# Patient Record
Sex: Female | Born: 1975 | Race: Black or African American | Hispanic: No | Marital: Single
Health system: Midwestern US, Community
[De-identification: ages and names within clinical notes are randomized; demographics above are authoritative.]

## PROBLEM LIST (undated history)

## (undated) DIAGNOSIS — I10 Essential (primary) hypertension: Secondary | ICD-10-CM

## (undated) DIAGNOSIS — N611 Abscess of the breast and nipple: Secondary | ICD-10-CM

## (undated) DIAGNOSIS — G8918 Other acute postprocedural pain: Secondary | ICD-10-CM

## (undated) DIAGNOSIS — N649 Disorder of breast, unspecified: Secondary | ICD-10-CM

## (undated) HISTORY — PX: TUBAL LIGATION: SHX77

## (undated) HISTORY — PX: LAPAROSCOPIC LYSIS OF ADHESIONS: SHX5905

---

## 2007-07-02 ENCOUNTER — Emergency Department (HOSPITAL_COMMUNITY): Admission: EM | Admit: 2007-07-02 | Discharge: 2007-07-03 | Payer: Self-pay | Admitting: Emergency Medicine

## 2007-07-28 LAB — TYPE AND SCREEN
ABO/Rh: A POS
Antibody Screen: NEGATIVE

## 2007-07-28 LAB — BETA HCG, QT
Beta HCG, QT: 53243 m[IU]/mL — ABNORMAL HIGH (ref 0–6)
hCG Quant: 53243 m[IU]/mL — ABNORMAL HIGH (ref 0–6)

## 2007-07-28 LAB — URINALYSIS W/MICROSCOPIC
Bacteria: NEGATIVE /HPF
Bilirubin: NEGATIVE
Glucose: 500 MG/DL — AB
Ketone: 15 MG/DL — AB
Leukocyte Esterase: NEGATIVE
Nitrites: NEGATIVE
Protein: NEGATIVE MG/DL
Specific gravity: 1.013 (ref 1.003–1.030)
Urobilinogen: 0.2 EU/DL (ref 0.2–1.0)
pH (UA): 6 (ref 5.0–8.0)

## 2007-07-28 LAB — CBC WITH AUTOMATED DIFF
ABS. BASOPHILS: 0 10*3/uL (ref 0.0–0.1)
ABS. EOSINOPHILS: 0.1 10*3/uL (ref 0.0–0.4)
ABS. LYMPHOCYTES: 2.1 10*3/uL (ref 0.8–3.5)
ABS. MONOCYTES: 0.6 10*3/uL (ref 0–1.0)
ABS. NEUTROPHILS: 5.7 10*3/uL (ref 1.8–8.0)
BASOPHILS: 0 % (ref 0–1)
EOSINOPHILS: 1 % (ref 0–7)
HCT: 37 % (ref 35.0–47.0)
HGB: 13.1 g/dL (ref 11.5–16.0)
LYMPHOCYTES: 24 % (ref 12–49)
MCH: 28.7 PG (ref 26.0–34.0)
MCHC: 35.4 g/dL — ABNORMAL HIGH (ref 30.0–35.0)
MCV: 81.1 FL (ref 80.0–99.0)
MONOCYTES: 7 % (ref 5–13)
NEUTROPHILS: 68 % (ref 32–75)
PLATELET: 360 10*3/uL (ref 150–400)
RBC: 4.56 M/uL (ref 3.80–5.20)
RDW: 12.9 % (ref 11.5–14.5)
WBC: 8.5 10*3/uL (ref 3.6–11.0)

## 2007-07-28 LAB — TYPE & SCREEN
ABO/Rh(D): A POS
Antibody screen: NEGATIVE

## 2007-07-28 LAB — GLUCOSE, POC: Glucose (POC): 234 mg/dL — ABNORMAL HIGH (ref 65–105)

## 2007-07-31 LAB — METABOLIC PANEL, COMPREHENSIVE
A-G Ratio: 0.9 — ABNORMAL LOW (ref 1.1–2.2)
ALT (SGPT): 28 U/L — ABNORMAL LOW (ref 30–65)
AST (SGOT): 11 U/L — ABNORMAL LOW (ref 15–37)
Albumin: 3.6 g/dL (ref 3.5–5.0)
Alk. phosphatase: 74 U/L (ref 50–136)
Anion gap: 10 (ref 5–15)
BUN/Creatinine ratio: 5 — ABNORMAL LOW (ref 12–20)
BUN: 4 MG/DL — ABNORMAL LOW (ref 6–20)
Bilirubin, total: 0.4 MG/DL (ref 0–1.0)
CO2: 24 MMOL/L (ref 21–32)
Calcium: 9.1 MG/DL (ref 8.5–10.1)
Chloride: 101 MMOL/L (ref 97–108)
Creatinine: 0.8 MG/DL (ref 0.6–1.3)
GFR est AA: >60 mL/min/{1.73_m2} (ref >60)
GFR est non-AA: >60 mL/min/{1.73_m2} (ref >60)
Globulin: 3.8 g/dL (ref 2.0–4.0)
Glucose: 197 MG/DL — ABNORMAL HIGH (ref 65–105)
Potassium: 3.9 MMOL/L (ref 3.5–5.1)
Protein, total: 7.4 g/dL (ref 6.4–8.2)
Sodium: 135 MMOL/L — ABNORMAL LOW (ref 136–145)

## 2007-07-31 LAB — CBC WITH AUTOMATED DIFF
ABS. BASOPHILS: 0 10*3/uL (ref 0.0–0.1)
ABS. EOSINOPHILS: 0 10*3/uL (ref 0.0–0.4)
ABS. LYMPHOCYTES: 2.6 10*3/uL (ref 0.8–3.5)
ABS. MONOCYTES: 0.5 10*3/uL (ref 0–1.0)
ABS. NEUTROPHILS: 5.3 10*3/uL (ref 1.8–8.0)
BASOPHILS: 0 % (ref 0–1)
EOSINOPHILS: 1 % (ref 0–7)
HCT: 38.8 % (ref 35.0–47.0)
HGB: 13.6 g/dL (ref 11.5–16.0)
LYMPHOCYTES: 30 % (ref 12–49)
MCH: 28.7 PG (ref 26.0–34.0)
MCHC: 35.1 g/dL — ABNORMAL HIGH (ref 30.0–35.0)
MCV: 81.9 FL (ref 80.0–99.0)
MONOCYTES: 6 % (ref 5–13)
NEUTROPHILS: 63 % (ref 32–75)
PLATELET: 401 10*3/uL — ABNORMAL HIGH (ref 150–400)
RBC: 4.74 M/uL (ref 3.80–5.20)
RDW: 12.6 % (ref 11.5–14.5)
WBC: 8.4 10*3/uL (ref 3.6–11.0)

## 2007-07-31 LAB — URINALYSIS W/ REFLEX CULTURE
Bacteria: NEGATIVE /HPF
Bilirubin: NEGATIVE
Blood: NEGATIVE
Glucose: 500 MG/DL — AB
Leukocyte Esterase: NEGATIVE
Nitrites: NEGATIVE
Protein: NEGATIVE MG/DL
Specific gravity: 1.02 (ref 1.003–1.030)
Urobilinogen: 0.2 EU/DL (ref 0.2–1.0)
pH (UA): 6 (ref 5.0–8.0)

## 2007-07-31 LAB — GLUCOSE, POC: Glucose (POC): 114 mg/dL — ABNORMAL HIGH (ref 65–105)

## 2007-08-01 LAB — TYPE AND SCREEN
ABO/Rh: A POS
Antibody Screen: NEGATIVE

## 2007-08-01 LAB — CBC WITH AUTOMATED DIFF
ABS. BASOPHILS: 0 10*3/uL (ref 0.0–0.1)
ABS. EOSINOPHILS: 0.1 10*3/uL (ref 0.0–0.4)
ABS. LYMPHOCYTES: 2.8 10*3/uL (ref 0.8–3.5)
ABS. MONOCYTES: 0.5 10*3/uL (ref 0–1.0)
ABS. NEUTROPHILS: 4.8 10*3/uL (ref 1.8–8.0)
BASOPHILS: 0 % (ref 0–1)
EOSINOPHILS: 1 % (ref 0–7)
HCT: 36.8 % (ref 35.0–47.0)
HGB: 12.6 g/dL (ref 11.5–16.0)
LYMPHOCYTES: 34 % (ref 12–49)
MCH: 28 PG (ref 26.0–34.0)
MCHC: 34.2 g/dL (ref 30.0–35.0)
MCV: 81.8 FL (ref 80.0–99.0)
MONOCYTES: 6 % (ref 5–13)
NEUTROPHILS: 59 % (ref 32–75)
PLATELET: 388 10*3/uL (ref 150–400)
RBC: 4.5 M/uL (ref 3.80–5.20)
RDW: 12.7 % (ref 11.5–14.5)
WBC: 8.1 10*3/uL (ref 3.6–11.0)

## 2007-08-01 LAB — METABOLIC PANEL, COMPREHENSIVE
A-G Ratio: 0.9 — ABNORMAL LOW (ref 1.1–2.2)
ALT (SGPT): 28 U/L — ABNORMAL LOW (ref 30–65)
AST (SGOT): 10 U/L — ABNORMAL LOW (ref 15–37)
Albumin: 3.2 g/dL — ABNORMAL LOW (ref 3.5–5.0)
Alk. phosphatase: 73 U/L (ref 50–136)
Anion gap: 11 (ref 5–15)
BUN/Creatinine ratio: 8 — ABNORMAL LOW (ref 12–20)
BUN: 6 MG/DL (ref 6–20)
Bilirubin, total: 0.2 MG/DL (ref 0–1.0)
CO2: 23 MMOL/L (ref 21–32)
Calcium: 8.7 MG/DL (ref 8.5–10.1)
Chloride: 102 MMOL/L (ref 97–108)
Creatinine: 0.8 MG/DL (ref 0.6–1.3)
GFR est AA: >60 mL/min/{1.73_m2} (ref >60)
GFR est non-AA: >60 mL/min/{1.73_m2} (ref >60)
Globulin: 3.5 g/dL (ref 2.0–4.0)
Glucose: 192 MG/DL — ABNORMAL HIGH (ref 65–105)
Potassium: 3.8 MMOL/L (ref 3.5–5.1)
Protein, total: 6.7 g/dL (ref 6.4–8.2)
Sodium: 136 MMOL/L (ref 136–145)

## 2007-08-01 LAB — TYPE & SCREEN
ABO/Rh(D): A POS
Antibody screen: NEGATIVE

## 2007-08-01 LAB — GLUCOSE, POC
Glucose (POC): 186 mg/dL — ABNORMAL HIGH (ref 65–105)
Glucose (POC): 229 mg/dL — ABNORMAL HIGH (ref 65–105)
Glucose (POC): 129 mg/dL — ABNORMAL HIGH (ref 65–105)
Glucose (POC): 166 mg/dL — ABNORMAL HIGH (ref 65–105)
Glucose (POC): 137 mg/dL — ABNORMAL HIGH (ref 65–105)

## 2007-08-02 LAB — HEMOGLOBIN A1C WITH EAG: Hemoglobin A1c: 7.9 % — ABNORMAL HIGH (ref 4.2–5.8)

## 2008-01-04 ENCOUNTER — Emergency Department (HOSPITAL_COMMUNITY): Admission: EM | Admit: 2008-01-04 | Discharge: 2008-01-04 | Payer: Self-pay | Admitting: Emergency Medicine

## 2009-02-08 IMAGING — US US OB COMP LESS 14 WK
1 series · 14 of 28 positions shown · non-contrast
Comparison: none

CLINICAL DATA: Bloody discharge

TRANSABDOMINAL AND TRANSVAGINAL OB ULTRASOUND, LESS THAN 14 WEEKS:

[Series 1: unknown · 0.32mm/px · 14 of 44 slices shown]
[im 2/44]
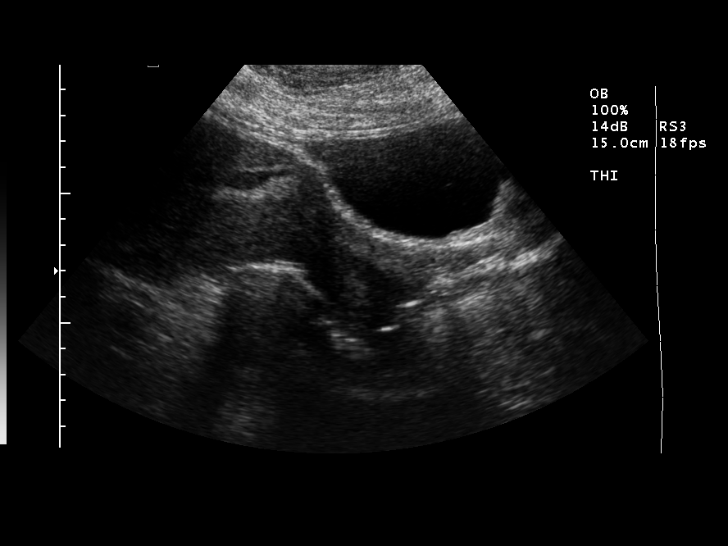
[im 5/44]
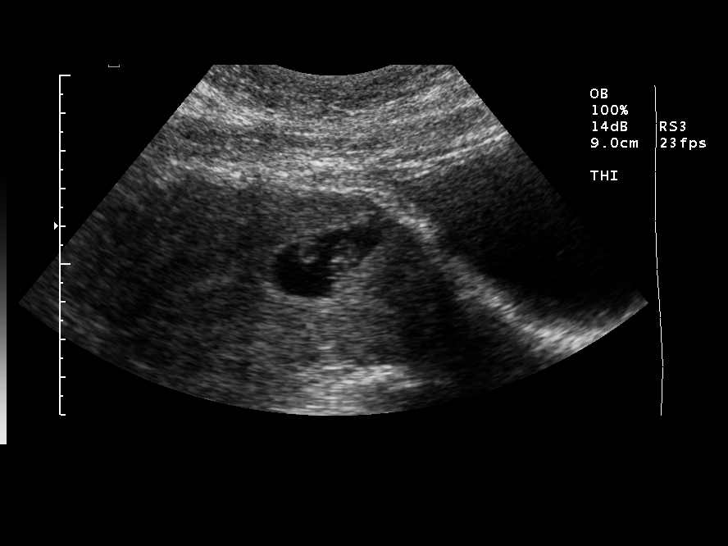
[im 8/44]
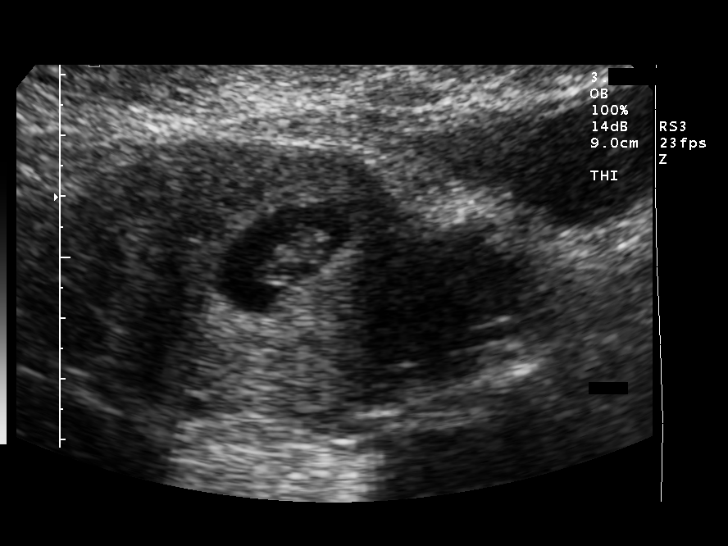
[im 12/44]
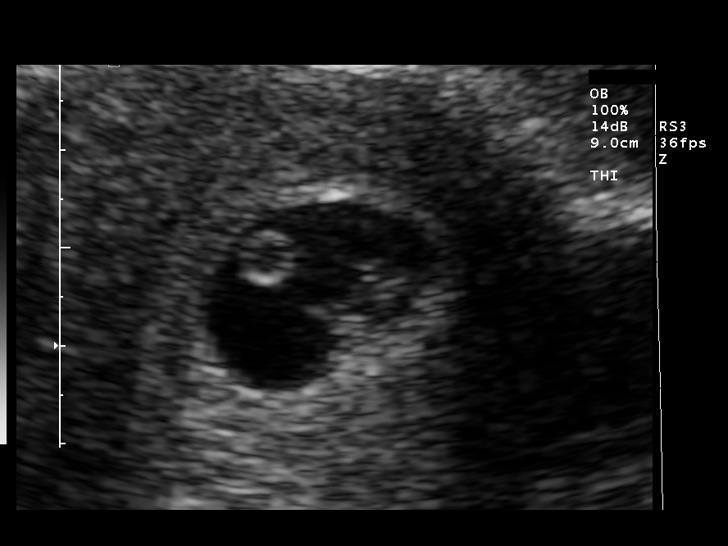
[im 15/44]
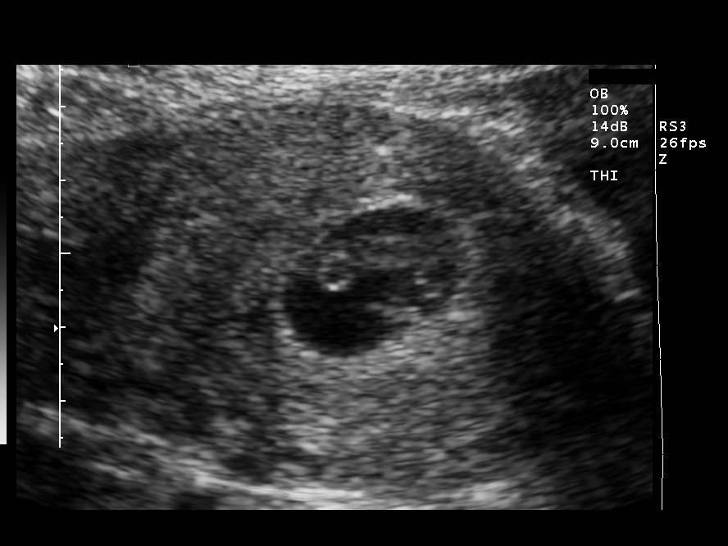
[im 18/44]
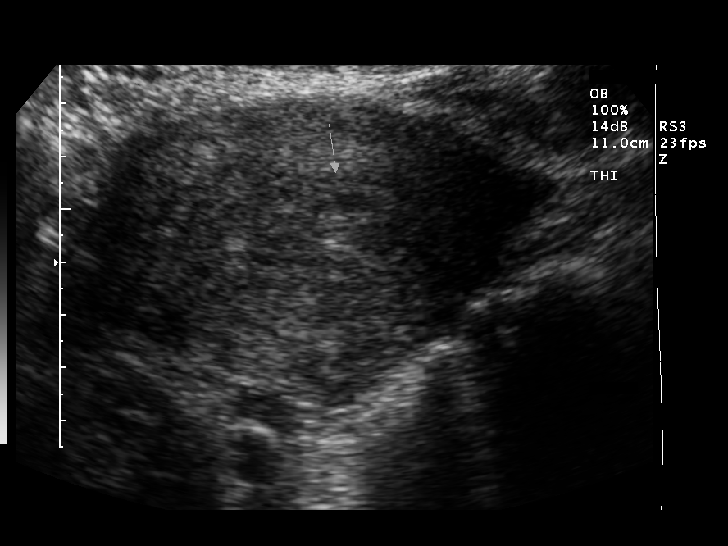
[im 21/44]
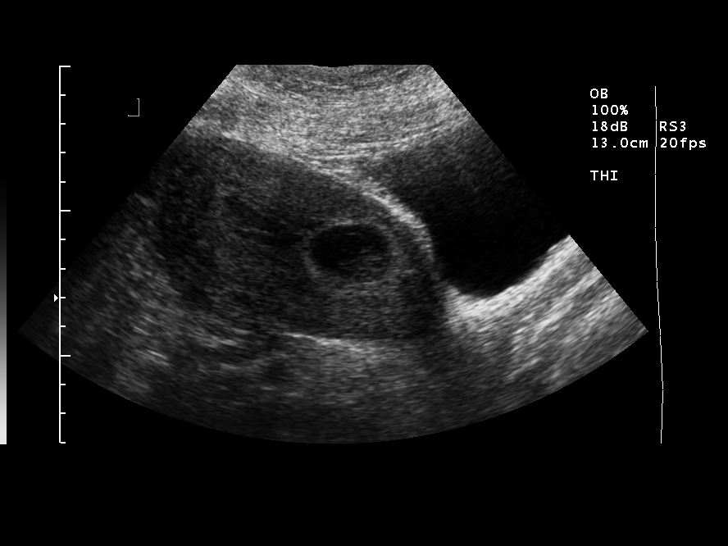
[im 24/44]
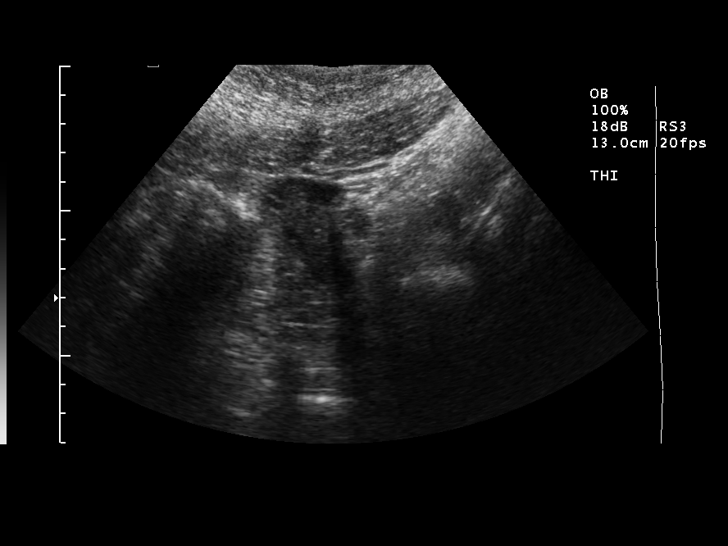
[im 28/44]
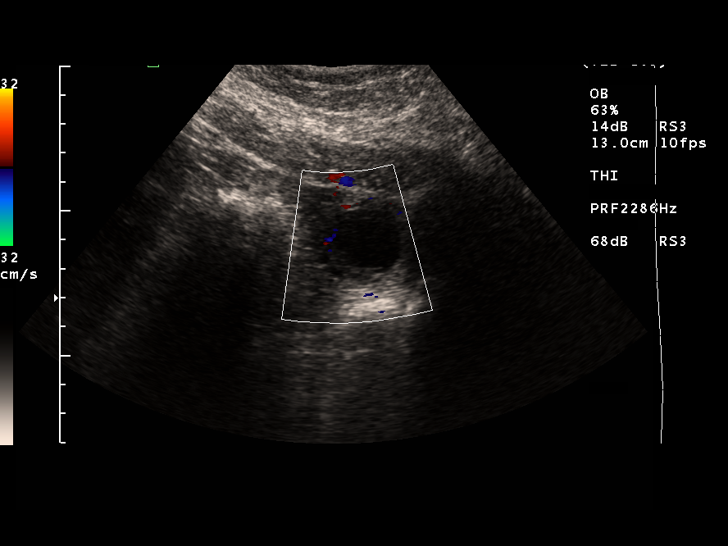
[im 31/44]
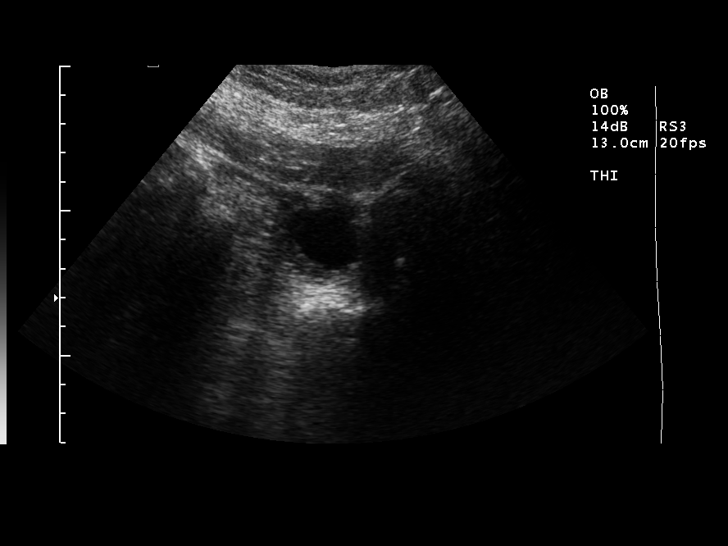
[im 34/44]
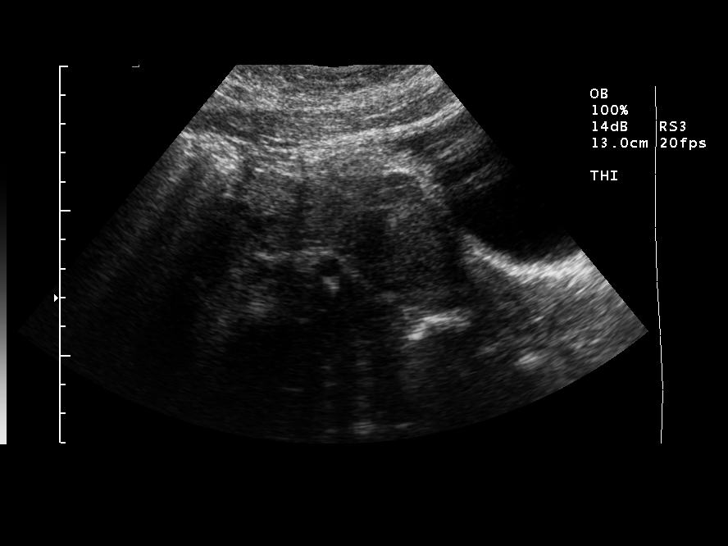
[im 37/44]
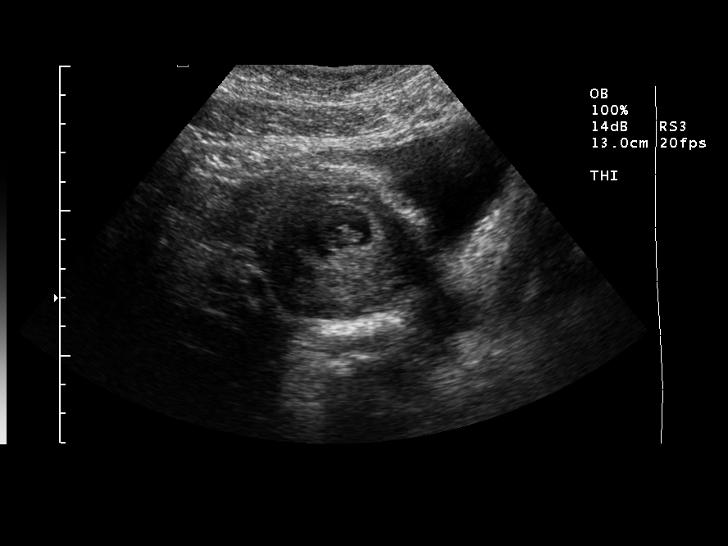
[im 40/44]
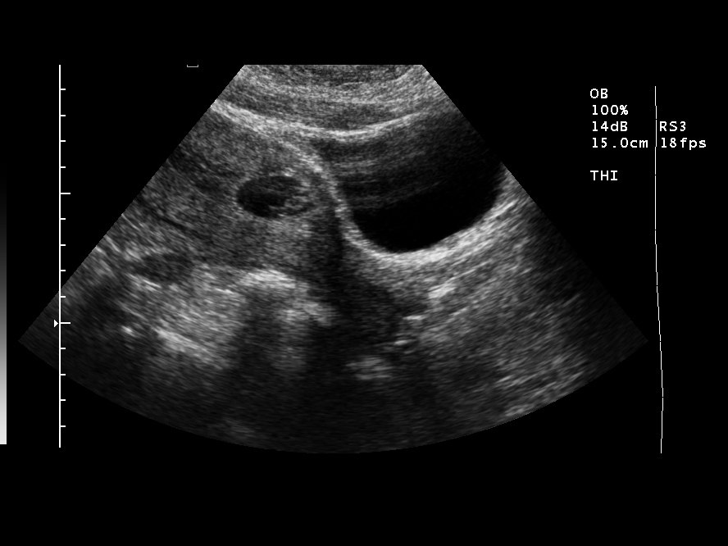
[im 44/44]
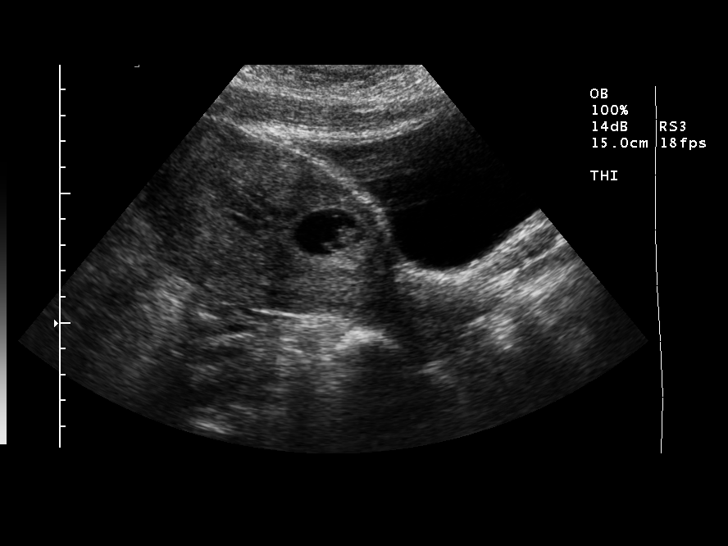

[14 of 28 positions shown; findings below may reference images not displayed]

FINDINGS: There is a single intrauterine pregnancy. Based on crown-rump length,
estimated gestational age is 7 weeks 4 days. Fetal heart rate 157 beats per
minute.

There is a small subchorionic hemorrhage. Ovaries are unremarkable. Small left
corpus luteum cyst present. No free fluid.
IMPRESSION: Single viable intrauterine pregnancy, 7 weeks 4 days by crown-rump length. Fetal
heart rate 157 beats per minute.

Small subchorionic hemorrhage.

## 2009-05-07 ENCOUNTER — Inpatient Hospital Stay (HOSPITAL_COMMUNITY): Admission: AD | Admit: 2009-05-07 | Discharge: 2009-05-07 | Payer: Self-pay | Admitting: Obstetrics and Gynecology

## 2009-07-05 ENCOUNTER — Inpatient Hospital Stay (HOSPITAL_COMMUNITY): Admission: AD | Admit: 2009-07-05 | Discharge: 2009-07-05 | Payer: Self-pay | Admitting: Family Medicine

## 2009-07-08 ENCOUNTER — Ambulatory Visit: Payer: Self-pay | Admitting: Obstetrics & Gynecology

## 2009-07-08 ENCOUNTER — Encounter: Admission: RE | Admit: 2009-07-08 | Discharge: 2009-07-08 | Payer: Self-pay | Admitting: Obstetrics & Gynecology

## 2009-07-10 ENCOUNTER — Ambulatory Visit (HOSPITAL_COMMUNITY): Admission: RE | Admit: 2009-07-10 | Discharge: 2009-07-10 | Payer: Self-pay | Admitting: Obstetrics & Gynecology

## 2009-07-11 ENCOUNTER — Ambulatory Visit: Payer: Self-pay | Admitting: Obstetrics & Gynecology

## 2009-07-18 ENCOUNTER — Ambulatory Visit: Payer: Self-pay | Admitting: Obstetrics & Gynecology

## 2009-07-25 ENCOUNTER — Ambulatory Visit: Payer: Self-pay | Admitting: Obstetrics & Gynecology

## 2009-07-29 ENCOUNTER — Ambulatory Visit: Payer: Self-pay | Admitting: Obstetrics & Gynecology

## 2009-08-05 ENCOUNTER — Encounter: Payer: Self-pay | Admitting: Obstetrics & Gynecology

## 2009-08-05 ENCOUNTER — Ambulatory Visit: Payer: Self-pay | Admitting: Obstetrics & Gynecology

## 2009-08-05 LAB — CONVERTED CEMR LAB
Albumin: 4 g/dL (ref 3.5–5.2)
Chloride: 101 meq/L (ref 96–112)
Collection Interval-CRCL: 24 hr
Creatinine Clearance: 232 mL/min — ABNORMAL HIGH (ref 75–115)
Creatinine, Ser: 0.72 mg/dL (ref 0.40–1.20)
Creatinine, Urine: 70.7 mg/dL
Hgb A1c MFr Bld: 7.3 % — ABNORMAL HIGH (ref 4.6–6.1)
MCV: 84.5 fL (ref 78.0–100.0)
Platelets: 382 10*3/uL (ref 150–400)
Potassium: 4.1 meq/L (ref 3.5–5.3)
RBC: 4.51 M/uL (ref 3.87–5.11)
Sodium: 136 meq/L (ref 135–145)
Total Bilirubin: 0.4 mg/dL (ref 0.3–1.2)
Total Protein: 7.5 g/dL (ref 6.0–8.3)
WBC: 12.4 10*3/uL — ABNORMAL HIGH (ref 4.0–10.5)

## 2009-08-15 ENCOUNTER — Ambulatory Visit: Payer: Self-pay | Admitting: Obstetrics & Gynecology

## 2009-08-18 ENCOUNTER — Ambulatory Visit: Payer: Self-pay | Admitting: Obstetrics and Gynecology

## 2009-08-18 ENCOUNTER — Inpatient Hospital Stay (HOSPITAL_COMMUNITY): Admission: AD | Admit: 2009-08-18 | Discharge: 2009-08-18 | Payer: Self-pay | Admitting: Obstetrics & Gynecology

## 2009-08-19 ENCOUNTER — Ambulatory Visit (HOSPITAL_COMMUNITY): Admission: RE | Admit: 2009-08-19 | Discharge: 2009-08-19 | Payer: Self-pay | Admitting: Obstetrics & Gynecology

## 2009-08-19 ENCOUNTER — Ambulatory Visit: Payer: Self-pay | Admitting: Obstetrics & Gynecology

## 2009-08-22 ENCOUNTER — Ambulatory Visit (HOSPITAL_COMMUNITY): Admission: RE | Admit: 2009-08-22 | Discharge: 2009-08-22 | Payer: Self-pay | Admitting: Obstetrics & Gynecology

## 2009-08-26 ENCOUNTER — Ambulatory Visit: Payer: Self-pay | Admitting: Obstetrics & Gynecology

## 2009-09-04 ENCOUNTER — Encounter: Payer: Self-pay | Admitting: Obstetrics & Gynecology

## 2009-09-04 ENCOUNTER — Ambulatory Visit: Payer: Self-pay | Admitting: Obstetrics & Gynecology

## 2009-09-04 LAB — CONVERTED CEMR LAB

## 2009-09-09 ENCOUNTER — Ambulatory Visit (HOSPITAL_COMMUNITY): Admission: RE | Admit: 2009-09-09 | Discharge: 2009-09-09 | Payer: Self-pay | Admitting: Obstetrics & Gynecology

## 2009-09-09 ENCOUNTER — Ambulatory Visit: Payer: Self-pay | Admitting: Obstetrics & Gynecology

## 2009-09-16 ENCOUNTER — Ambulatory Visit: Payer: Self-pay | Admitting: Obstetrics & Gynecology

## 2009-09-16 LAB — CONVERTED CEMR LAB
ALT: 15 units/L (ref 0–35)
Alkaline Phosphatase: 55 units/L (ref 39–117)
HCT: 38.3 % (ref 36.0–46.0)
Hemoglobin: 12.9 g/dL (ref 12.0–15.0)
Platelets: 406 10*3/uL — ABNORMAL HIGH (ref 150–400)
RBC: 4.53 M/uL (ref 3.87–5.11)
RDW: 13.3 % (ref 11.5–15.5)
Sodium: 135 meq/L (ref 135–145)
Total Bilirubin: 0.3 mg/dL (ref 0.3–1.2)
Total Protein: 7 g/dL (ref 6.0–8.3)
WBC: 8.8 10*3/uL (ref 4.0–10.5)

## 2009-09-23 ENCOUNTER — Ambulatory Visit: Payer: Self-pay | Admitting: Obstetrics and Gynecology

## 2009-09-30 ENCOUNTER — Encounter: Admission: RE | Admit: 2009-09-30 | Discharge: 2009-10-18 | Payer: Self-pay | Admitting: Obstetrics & Gynecology

## 2009-09-30 ENCOUNTER — Ambulatory Visit: Payer: Self-pay | Admitting: Obstetrics & Gynecology

## 2009-10-08 ENCOUNTER — Ambulatory Visit (HOSPITAL_COMMUNITY): Admission: RE | Admit: 2009-10-08 | Discharge: 2009-10-08 | Payer: Self-pay | Admitting: Obstetrics & Gynecology

## 2009-10-08 ENCOUNTER — Ambulatory Visit: Payer: Self-pay | Admitting: Obstetrics & Gynecology

## 2009-10-11 ENCOUNTER — Ambulatory Visit: Payer: Self-pay | Admitting: Obstetrics and Gynecology

## 2009-10-15 ENCOUNTER — Ambulatory Visit: Payer: Self-pay | Admitting: Obstetrics & Gynecology

## 2009-10-17 ENCOUNTER — Inpatient Hospital Stay (HOSPITAL_COMMUNITY): Admission: AD | Admit: 2009-10-17 | Discharge: 2009-10-20 | Payer: Self-pay | Admitting: Obstetrics & Gynecology

## 2009-10-17 ENCOUNTER — Ambulatory Visit: Payer: Self-pay | Admitting: Family Medicine

## 2009-10-20 ENCOUNTER — Ambulatory Visit: Payer: Self-pay | Admitting: Obstetrics and Gynecology

## 2009-10-21 ENCOUNTER — Ambulatory Visit: Payer: Self-pay | Admitting: Physician Assistant

## 2009-10-21 ENCOUNTER — Inpatient Hospital Stay (HOSPITAL_COMMUNITY): Admission: AD | Admit: 2009-10-21 | Discharge: 2009-10-21 | Payer: Self-pay | Admitting: Obstetrics & Gynecology

## 2009-10-24 ENCOUNTER — Ambulatory Visit: Payer: Self-pay | Admitting: Obstetrics & Gynecology

## 2009-10-28 ENCOUNTER — Ambulatory Visit (HOSPITAL_COMMUNITY): Admission: RE | Admit: 2009-10-28 | Discharge: 2009-10-28 | Payer: Self-pay | Admitting: Obstetrics & Gynecology

## 2009-10-28 ENCOUNTER — Ambulatory Visit: Payer: Self-pay | Admitting: Obstetrics & Gynecology

## 2009-10-28 ENCOUNTER — Encounter: Payer: Self-pay | Admitting: Family Medicine

## 2009-10-31 ENCOUNTER — Inpatient Hospital Stay (HOSPITAL_COMMUNITY): Admission: AD | Admit: 2009-10-31 | Discharge: 2009-10-31 | Payer: Self-pay | Admitting: Obstetrics & Gynecology

## 2009-10-31 ENCOUNTER — Encounter: Payer: Self-pay | Admitting: Family Medicine

## 2009-10-31 ENCOUNTER — Ambulatory Visit: Payer: Self-pay | Admitting: Family Medicine

## 2009-10-31 ENCOUNTER — Ambulatory Visit: Payer: Self-pay | Admitting: Obstetrics & Gynecology

## 2009-11-04 ENCOUNTER — Ambulatory Visit (HOSPITAL_COMMUNITY): Admission: RE | Admit: 2009-11-04 | Discharge: 2009-11-04 | Payer: Self-pay | Admitting: Obstetrics & Gynecology

## 2009-11-04 ENCOUNTER — Ambulatory Visit: Payer: Self-pay | Admitting: Obstetrics & Gynecology

## 2009-11-05 ENCOUNTER — Inpatient Hospital Stay (HOSPITAL_COMMUNITY): Admission: AD | Admit: 2009-11-05 | Discharge: 2009-11-05 | Payer: Self-pay | Admitting: Family Medicine

## 2009-11-05 ENCOUNTER — Encounter: Payer: Self-pay | Admitting: Obstetrics & Gynecology

## 2009-11-07 ENCOUNTER — Inpatient Hospital Stay (HOSPITAL_COMMUNITY): Admission: RE | Admit: 2009-11-07 | Discharge: 2009-11-11 | Payer: Self-pay | Admitting: Obstetrics and Gynecology

## 2009-11-07 ENCOUNTER — Encounter: Payer: Self-pay | Admitting: Family Medicine

## 2009-11-12 ENCOUNTER — Ambulatory Visit: Payer: Self-pay | Admitting: Family Medicine

## 2010-01-08 ENCOUNTER — Ambulatory Visit: Payer: Self-pay | Admitting: Obstetrics and Gynecology

## 2010-01-08 LAB — CONVERTED CEMR LAB
Hemoglobin: 15.2 g/dL — ABNORMAL HIGH (ref 12.0–15.0)
MCHC: 34.9 g/dL (ref 30.0–36.0)
Platelets: 398 10*3/uL (ref 150–400)
RDW: 13.4 % (ref 11.5–15.5)

## 2010-06-01 ENCOUNTER — Encounter: Payer: Self-pay | Admitting: Family Medicine

## 2010-06-01 ENCOUNTER — Encounter: Payer: Self-pay | Admitting: Obstetrics & Gynecology

## 2010-07-27 LAB — POCT URINALYSIS DIP (DEVICE)
Bilirubin Urine: NEGATIVE
Glucose, UA: 500 mg/dL — AB
Nitrite: NEGATIVE
Nitrite: NEGATIVE
Nitrite: NEGATIVE
Protein, ur: 30 mg/dL — AB
Protein, ur: NEGATIVE mg/dL
Urobilinogen, UA: 0.2 mg/dL (ref 0.0–1.0)
Urobilinogen, UA: 0.2 mg/dL (ref 0.0–1.0)
Urobilinogen, UA: 0.2 mg/dL (ref 0.0–1.0)
pH: 5.5 (ref 5.0–8.0)

## 2010-07-27 LAB — GLUCOSE, CAPILLARY
Glucose-Capillary: 175 mg/dL — ABNORMAL HIGH (ref 70–99)
Glucose-Capillary: 200 mg/dL — ABNORMAL HIGH (ref 70–99)
Glucose-Capillary: 225 mg/dL — ABNORMAL HIGH (ref 70–99)
Glucose-Capillary: 233 mg/dL — ABNORMAL HIGH (ref 70–99)
Glucose-Capillary: 243 mg/dL — ABNORMAL HIGH (ref 70–99)
Glucose-Capillary: 260 mg/dL — ABNORMAL HIGH (ref 70–99)

## 2010-07-27 LAB — COMPREHENSIVE METABOLIC PANEL
AST: 19 U/L (ref 0–37)
Albumin: 2.5 g/dL — ABNORMAL LOW (ref 3.5–5.2)
Alkaline Phosphatase: 88 U/L (ref 39–117)
Chloride: 107 mEq/L (ref 96–112)
Creatinine, Ser: 0.64 mg/dL (ref 0.4–1.2)
GFR calc Af Amer: >60 mL/min (ref >60)
Potassium: 3.9 mEq/L (ref 3.5–5.1)
Total Bilirubin: 0.3 mg/dL (ref 0.3–1.2)
Total Protein: 5.9 g/dL — ABNORMAL LOW (ref 6.0–8.3)

## 2010-07-27 LAB — BASIC METABOLIC PANEL
CO2: 22 mEq/L (ref 19–32)
Calcium: 8.9 mg/dL (ref 8.4–10.5)
Creatinine, Ser: 0.73 mg/dL (ref 0.4–1.2)
GFR calc Af Amer: >60 mL/min (ref >60)
Sodium: 136 mEq/L (ref 135–145)

## 2010-07-27 LAB — CBC
Hemoglobin: 12.2 g/dL (ref 12.0–15.0)
Hemoglobin: 12.8 g/dL (ref 12.0–15.0)
MCH: 29.9 pg (ref 26.0–34.0)
MCH: 29.3 pg (ref 26.0–34.0)
MCHC: 33.8 g/dL (ref 30.0–36.0)
Platelets: 319 10*3/uL (ref 150–400)
Platelets: 331 10*3/uL (ref 150–400)
Platelets: 365 10*3/uL (ref 150–400)
RBC: 4.03 MIL/uL (ref 3.87–5.11)
RBC: 4.36 MIL/uL (ref 3.87–5.11)
RDW: 13.2 % (ref 11.5–15.5)
WBC: 4.6 10*3/uL (ref 4.0–10.5)
WBC: 5.1 10*3/uL (ref 4.0–10.5)

## 2010-07-27 LAB — TYPE AND SCREEN: ABO/RH(D): A POS

## 2010-07-27 LAB — RPR: RPR Ser Ql: NONREACTIVE

## 2010-07-28 LAB — GC/CHLAMYDIA PROBE AMP, GENITAL: Chlamydia, DNA Probe: NEGATIVE

## 2010-07-28 LAB — POCT URINALYSIS DIP (DEVICE)
Bilirubin Urine: NEGATIVE
Hgb urine dipstick: NEGATIVE
Hgb urine dipstick: NEGATIVE
Nitrite: NEGATIVE
Nitrite: NEGATIVE
Protein, ur: 100 mg/dL — AB
Protein, ur: NEGATIVE mg/dL
Specific Gravity, Urine: <=1.005 (ref 1.005–1.030)
Urobilinogen, UA: 0.2 mg/dL (ref 0.0–1.0)
pH: 5 (ref 5.0–8.0)
pH: 5.5 (ref 5.0–8.0)

## 2010-07-28 LAB — BLOOD GAS, ARTERIAL
Bicarbonate: 19.3 mEq/L — ABNORMAL LOW (ref 20.0–24.0)
Drawn by: 143
TCO2: 20.3 mmol/L (ref 0–100)
pCO2 arterial: 31.7 mmHg — ABNORMAL LOW (ref 35.0–45.0)
pH, Arterial: 7.401 — ABNORMAL HIGH (ref 7.350–7.400)

## 2010-07-28 LAB — GLUCOSE, CAPILLARY
Glucose-Capillary: 126 mg/dL — ABNORMAL HIGH (ref 70–99)
Glucose-Capillary: 129 mg/dL — ABNORMAL HIGH (ref 70–99)
Glucose-Capillary: 173 mg/dL — ABNORMAL HIGH (ref 70–99)
Glucose-Capillary: 186 mg/dL — ABNORMAL HIGH (ref 70–99)
Glucose-Capillary: 192 mg/dL — ABNORMAL HIGH (ref 70–99)
Glucose-Capillary: 193 mg/dL — ABNORMAL HIGH (ref 70–99)
Glucose-Capillary: 206 mg/dL — ABNORMAL HIGH (ref 70–99)
Glucose-Capillary: 213 mg/dL — ABNORMAL HIGH (ref 70–99)
Glucose-Capillary: 233 mg/dL — ABNORMAL HIGH (ref 70–99)
Glucose-Capillary: 297 mg/dL — ABNORMAL HIGH (ref 70–99)
Glucose-Capillary: 311 mg/dL — ABNORMAL HIGH (ref 70–99)
Glucose-Capillary: 322 mg/dL — ABNORMAL HIGH (ref 70–99)

## 2010-07-28 LAB — CBC
HCT: 35.3 % — ABNORMAL LOW (ref 36.0–46.0)
Hemoglobin: 11.7 g/dL — ABNORMAL LOW (ref 12.0–15.0)
MCHC: 33.5 g/dL (ref 30.0–36.0)
MCV: 87.9 fL (ref 78.0–100.0)
Platelets: 331 10*3/uL (ref 150–400)
Platelets: 347 10*3/uL (ref 150–400)
RBC: 4.02 MIL/uL (ref 3.87–5.11)
RDW: 13.6 % (ref 11.5–15.5)
WBC: 7.4 10*3/uL (ref 4.0–10.5)

## 2010-07-28 LAB — COMPREHENSIVE METABOLIC PANEL
ALT: 21 U/L (ref 0–35)
Albumin: 2.7 g/dL — ABNORMAL LOW (ref 3.5–5.2)
Calcium: 7.4 mg/dL — ABNORMAL LOW (ref 8.4–10.5)
Glucose, Bld: 262 mg/dL — ABNORMAL HIGH (ref 70–99)
Sodium: 133 mEq/L — ABNORMAL LOW (ref 135–145)
Total Protein: 6.2 g/dL (ref 6.0–8.3)

## 2010-07-28 LAB — URINALYSIS, ROUTINE W REFLEX MICROSCOPIC
Glucose, UA: 100 mg/dL — AB
Glucose, UA: >1000 mg/dL — AB
Leukocytes, UA: NEGATIVE
Nitrite: NEGATIVE
Specific Gravity, Urine: 1.02 (ref 1.005–1.030)
pH: 5.5 (ref 5.0–8.0)
pH: 6 (ref 5.0–8.0)

## 2010-07-28 LAB — RPR: RPR Ser Ql: NONREACTIVE

## 2010-07-28 LAB — RAPID URINE DRUG SCREEN, HOSP PERFORMED
Amphetamines: NOT DETECTED
Barbiturates: NOT DETECTED
Benzodiazepines: NOT DETECTED
Cocaine: NOT DETECTED

## 2010-07-28 LAB — TYPE AND SCREEN
ABO/RH(D): A POS
Antibody Screen: NEGATIVE

## 2010-07-28 LAB — STREP B DNA PROBE

## 2010-07-28 LAB — WET PREP, GENITAL: Yeast Wet Prep HPF POC: NONE SEEN

## 2010-07-28 LAB — PROTEIN / CREATININE RATIO, URINE: Creatinine, Urine: 24.3 mg/dL

## 2010-07-28 LAB — MAGNESIUM: Magnesium: 4.2 mg/dL — ABNORMAL HIGH (ref 1.5–2.5)

## 2010-07-28 LAB — URINE MICROSCOPIC-ADD ON

## 2010-07-29 LAB — POCT URINALYSIS DIP (DEVICE)
Glucose, UA: >=1000 mg/dL — AB
Hgb urine dipstick: NEGATIVE
Hgb urine dipstick: NEGATIVE
Ketones, ur: NEGATIVE mg/dL
Nitrite: NEGATIVE
Nitrite: NEGATIVE
Protein, ur: NEGATIVE mg/dL
Protein, ur: NEGATIVE mg/dL
Specific Gravity, Urine: 1.015 (ref 1.005–1.030)
Urobilinogen, UA: 0.2 mg/dL (ref 0.0–1.0)
Urobilinogen, UA: 0.2 mg/dL (ref 0.0–1.0)
Urobilinogen, UA: 0.2 mg/dL (ref 0.0–1.0)
pH: 5.5 (ref 5.0–8.0)
pH: 5.5 (ref 5.0–8.0)

## 2010-07-30 LAB — POCT URINALYSIS DIP (DEVICE)
Protein, ur: 30 mg/dL — AB
Specific Gravity, Urine: 1.02 (ref 1.005–1.030)
Urobilinogen, UA: 0.2 mg/dL (ref 0.0–1.0)

## 2010-07-31 LAB — POCT URINALYSIS DIP (DEVICE)
Bilirubin Urine: NEGATIVE
Hgb urine dipstick: NEGATIVE
Ketones, ur: 40 mg/dL — AB
Specific Gravity, Urine: <=1.005 (ref 1.005–1.030)
pH: 5.5 (ref 5.0–8.0)

## 2010-08-04 LAB — POCT URINALYSIS DIP (DEVICE)
Bilirubin Urine: NEGATIVE
Glucose, UA: 250 mg/dL — AB
Hgb urine dipstick: NEGATIVE
Hgb urine dipstick: NEGATIVE
Ketones, ur: NEGATIVE mg/dL
Nitrite: NEGATIVE
Protein, ur: 30 mg/dL — AB
Specific Gravity, Urine: 1.015 (ref 1.005–1.030)
Specific Gravity, Urine: 1.015 (ref 1.005–1.030)
Urobilinogen, UA: 0.2 mg/dL (ref 0.0–1.0)
pH: 5 (ref 5.0–8.0)
pH: 5 (ref 5.0–8.0)

## 2010-08-11 LAB — WET PREP, GENITAL: Trich, Wet Prep: NONE SEEN

## 2010-08-11 LAB — CBC
Hemoglobin: 14.7 g/dL (ref 12.0–15.0)
Platelets: 404 10*3/uL — ABNORMAL HIGH (ref 150–400)
RDW: 13 % (ref 11.5–15.5)

## 2010-08-11 LAB — POCT PREGNANCY, URINE: Preg Test, Ur: POSITIVE

## 2010-08-11 LAB — ABO/RH: ABO/RH(D): A POS

## 2011-02-02 LAB — CBC
HCT: 43.2
Hemoglobin: 14.4
MCV: 87
RBC: 4.96
WBC: 13.8 — ABNORMAL HIGH

## 2011-02-02 LAB — POCT I-STAT CREATININE: Operator id: 192351

## 2011-02-02 LAB — I-STAT 8, (EC8 V) (CONVERTED LAB)
BUN: 5 — ABNORMAL LOW
Glucose, Bld: 290 — ABNORMAL HIGH
Hemoglobin: 16.3 — ABNORMAL HIGH
Potassium: 3.9
Sodium: 133 — ABNORMAL LOW
TCO2: 25

## 2011-02-02 LAB — URINALYSIS, ROUTINE W REFLEX MICROSCOPIC
Bilirubin Urine: NEGATIVE
Glucose, UA: >1000 — AB
Ketones, ur: 15 — AB
pH: 5.5

## 2011-02-02 LAB — DIFFERENTIAL
Eosinophils Absolute: 0.1
Eosinophils Relative: 1
Lymphs Abs: 2.4
Monocytes Relative: 5

## 2011-02-02 LAB — ABO/RH: ABO/RH(D): A POS

## 2011-05-07 ENCOUNTER — Emergency Department (HOSPITAL_COMMUNITY): Payer: Medicaid Other

## 2011-05-07 ENCOUNTER — Emergency Department (HOSPITAL_COMMUNITY)
Admission: EM | Admit: 2011-05-07 | Discharge: 2011-05-07 | Disposition: A | Discharge status: Home / Self Care | Payer: Medicaid Other | Attending: Emergency Medicine | Admitting: Emergency Medicine

## 2011-05-07 ENCOUNTER — Encounter: Payer: Self-pay | Admitting: *Deleted

## 2011-05-07 DIAGNOSIS — S62306A Unspecified fracture of fifth metacarpal bone, right hand, initial encounter for closed fracture: Secondary | ICD-10-CM

## 2011-05-07 DIAGNOSIS — S62339A Displaced fracture of neck of unspecified metacarpal bone, initial encounter for closed fracture: Secondary | ICD-10-CM | POA: Insufficient documentation

## 2011-05-07 DIAGNOSIS — W19XXXA Unspecified fall, initial encounter: Secondary | ICD-10-CM | POA: Insufficient documentation

## 2011-05-07 DIAGNOSIS — Z794 Long term (current) use of insulin: Secondary | ICD-10-CM | POA: Insufficient documentation

## 2011-05-07 DIAGNOSIS — M79609 Pain in unspecified limb: Secondary | ICD-10-CM | POA: Insufficient documentation

## 2011-05-07 DIAGNOSIS — E119 Type 2 diabetes mellitus without complications: Secondary | ICD-10-CM | POA: Insufficient documentation

## 2011-05-07 MED ORDER — INSULIN GLARGINE 100 UNIT/ML ~~LOC~~ SOLN: 34.0000 [IU] | Freq: Every day | SUBCUTANEOUS | Status: DC

## 2011-05-07 MED ORDER — HYDROCODONE-ACETAMINOPHEN 5-325 MG PO TABS: 1.0000 | ORAL_TABLET | ORAL | Status: AC | PRN

## 2011-05-07 MED ORDER — INSULIN LISPRO 100 UNIT/ML ~~LOC~~ SOLN: 22.0000 [IU] | Freq: Two times a day (BID) | SUBCUTANEOUS | Status: DC

## 2011-05-07 NOTE — ED Notes (Signed)
ORTHO TECH AWARE OF SPLINTING NEED .

## 2011-05-07 NOTE — ED Notes (Signed)
Patient states she was outside playing with her one years old and she fell on her right hand and it had been swollen and bruised since then. Patient states pain when she moves her ring finger and little finger on the right hand. Patient states she has used ice and heat on her hand x 2 days. Patients had appears swollen at this time.

## 2011-05-07 NOTE — ED Provider Notes (Signed)
History     CSN: [id number]  Arrival date & time 05/07/11  1512   First MD Initiated Contact with Patient 05/07/11 1723      Chief Complaint  Patient presents with  . Hand Injury    (Consider location/radiation/quality/duration/timing/severity/associated sxs/prior treatment) Patient is a 35 y.o. female presenting with hand injury.  Hand Injury  The incident occurred yesterday. The incident occurred at home. The injury mechanism was a fall. The pain is present in the right hand. The quality of the pain is described as aching. The pain is moderate. The pain has been fluctuating since the incident. She reports no foreign bodies present. The symptoms are aggravated by movement, use and palpation. She has tried ice for the symptoms. The treatment provided mild relief.   Patient reports she fell on hand while playing with her child in the yard. History reviewed. No pertinent past medical history. PMH:  IDDM   PCP: [facility] [place] (new--initial appt mid-January) Past Surgical History  Procedure Date  . Cesarean section   . Cesarean section x 5    History reviewed. No pertinent family history.  History  Substance Use Topics  . Smoking status: Former Games developer  . Smokeless tobacco: Not on file  . Alcohol Use: No    OB History    Grav Para Term Preterm Abortions TAB SAB Ect Mult Living                  Review of Systems  All other systems reviewed and are negative.    Allergies  Sulfa antibiotics  Home Medications   Current Outpatient Rx  Name Route Sig Dispense Refill  . INSULIN GLARGINE 100 UNIT/ML [place] SOLN Subcutaneous Inject 34 Units into the skin at bedtime.      . INSULIN LISPRO (HUMAN) 100 UNIT/ML [place] SOLN Subcutaneous Inject 22 Units into the skin 2 (two) times daily. Breakfast and lunch.       BP 132/91  Pulse 125  Temp(Src) 98.7 F (37.1 C) (Oral)  Resp 19  LMP 03/28/2011  Physical Exam  Constitutional: She is oriented to person, place,  and time. She appears well-developed and well-nourished.  HENT:  Head: Normocephalic and atraumatic.  Eyes: Pupils are equal, round, and reactive to light.  Neck: Normal range of motion. Neck supple.  Cardiovascular: Normal rate, regular rhythm and normal heart sounds.   Pulmonary/Chest: Effort normal and breath sounds normal.  Abdominal: Soft.  Musculoskeletal: She exhibits tenderness.       Hands: Neurological: She is alert and oriented to person, place, and time.  Skin: Skin is warm and dry.  Psychiatric: She has a normal mood and affect.    ED Course  Procedures (including critical care time)  Labs Reviewed - No data to display Dg Hand Complete Right  05/07/2011  *RADIOLOGY REPORT*  Clinical Data: 35 year old female with right hand pain following fall/injury.  RIGHT HAND - COMPLETE 3+ VIEW  Comparison: None  Findings: A minimally comminuted fracture of the fifth metacarpal neck/head is noted with apex dorsal angulation.  This fracture may extend into the joint. There is no evidence of subluxation or dislocation. No other bony abnormalities are present.  IMPRESSION: Distal fifth metacarpal fracture which may extend intraarticularly.  Original Report Authenticated By: Rosendo Gros, M.D.     No diagnosis found.    MDM        6:15 PM  Discussed case with Dr. Donnamarie Rossetti will provide follow-up in the office--please have patient contact  the office tomorrow to schedule appointment for early next week.  Request for ulnar gutter splint placed for ortho tech.    Jimmye Norman, NP 05/07/11 1841  Jimmye Norman, NP 05/07/11 [phone]

## 2011-05-07 NOTE — Progress Notes (Signed)
Orthopedic Tech Progress Note Patient Details:  Brittney Rice November 28, 1975 [id number]  Type of Splint: Other (comment) (ulner gutter) Splint Location: right hand Splint Interventions: Application    Nikki Dom 05/07/2011, 6:36 PM

## 2011-05-07 NOTE — ED Provider Notes (Signed)
Medical screening examination/treatment/procedure(s) were performed by non-physician practitioner and as supervising physician I was immediately available for consultation/collaboration.  Nicholes Stairs, MD 05/07/11 2005

## 2011-07-11 ENCOUNTER — Encounter (HOSPITAL_COMMUNITY): Payer: Self-pay | Admitting: Emergency Medicine

## 2011-07-11 ENCOUNTER — Emergency Department (HOSPITAL_COMMUNITY): Payer: Medicaid Other

## 2011-07-11 ENCOUNTER — Emergency Department (HOSPITAL_COMMUNITY)
Admission: EM | Admit: 2011-07-11 | Discharge: 2011-07-11 | Disposition: A | Discharge status: Home / Self Care | Payer: Medicaid Other | Source: Home / Self Care | Attending: Emergency Medicine | Admitting: Emergency Medicine

## 2011-07-11 ENCOUNTER — Emergency Department (HOSPITAL_COMMUNITY)
Admission: EM | Admit: 2011-07-11 | Discharge: 2011-07-12 | Disposition: A | Discharge status: Home / Self Care | Payer: Medicaid Other | Attending: Emergency Medicine | Admitting: Emergency Medicine

## 2011-07-11 DIAGNOSIS — R51 Headache: Secondary | ICD-10-CM | POA: Insufficient documentation

## 2011-07-11 DIAGNOSIS — G43909 Migraine, unspecified, not intractable, without status migrainosus: Secondary | ICD-10-CM | POA: Insufficient documentation

## 2011-07-11 DIAGNOSIS — Z79899 Other long term (current) drug therapy: Secondary | ICD-10-CM | POA: Insufficient documentation

## 2011-07-11 DIAGNOSIS — E119 Type 2 diabetes mellitus without complications: Secondary | ICD-10-CM | POA: Insufficient documentation

## 2011-07-11 DIAGNOSIS — Z794 Long term (current) use of insulin: Secondary | ICD-10-CM | POA: Insufficient documentation

## 2011-07-11 DIAGNOSIS — R110 Nausea: Secondary | ICD-10-CM | POA: Insufficient documentation

## 2011-07-11 DIAGNOSIS — H53149 Visual discomfort, unspecified: Secondary | ICD-10-CM | POA: Insufficient documentation

## 2011-07-11 DIAGNOSIS — I10 Essential (primary) hypertension: Secondary | ICD-10-CM | POA: Insufficient documentation

## 2011-07-11 HISTORY — DX: Essential (primary) hypertension: I10

## 2011-07-11 LAB — BASIC METABOLIC PANEL
BUN: 5 mg/dL — ABNORMAL LOW (ref 6–23)
CO2: 25 mEq/L (ref 19–32)
Calcium: 9.1 mg/dL (ref 8.4–10.5)
Chloride: 104 mEq/L (ref 96–112)
Creatinine, Ser: 0.74 mg/dL (ref 0.50–1.10)
GFR calc Af Amer: >90 mL/min (ref >90)
GFR calc non Af Amer: >90 mL/min (ref >90)
Glucose, Bld: 135 mg/dL — ABNORMAL HIGH (ref 70–99)
Potassium: 3.9 mEq/L (ref 3.5–5.1)
Sodium: 136 mEq/L (ref 135–145)

## 2011-07-11 LAB — CBC
HCT: 43.8 % (ref 36.0–46.0)
Hemoglobin: 15.7 g/dL — ABNORMAL HIGH (ref 12.0–15.0)
MCH: 29.2 pg (ref 26.0–34.0)
MCHC: 35.8 g/dL (ref 30.0–36.0)
MCV: 81.4 fL (ref 78.0–100.0)
Platelets: 403 10*3/uL — ABNORMAL HIGH (ref 150–400)
RBC: 5.38 MIL/uL — ABNORMAL HIGH (ref 3.87–5.11)
RDW: 14.5 % (ref 11.5–15.5)
WBC: 8.9 10*3/uL (ref 4.0–10.5)

## 2011-07-11 LAB — DIFFERENTIAL
Basophils Absolute: 0 10*3/uL (ref 0.0–0.1)
Basophils Relative: 0 % (ref 0–1)
Eosinophils Absolute: 0.1 10*3/uL (ref 0.0–0.7)
Eosinophils Relative: 1 % (ref 0–5)
Lymphocytes Relative: 30 % (ref 12–46)
Lymphs Abs: 2.7 10*3/uL (ref 0.7–4.0)
Monocytes Absolute: 0.6 10*3/uL (ref 0.1–1.0)
Monocytes Relative: 7 % (ref 3–12)
Neutro Abs: 5.4 10*3/uL (ref 1.7–7.7)
Neutrophils Relative %: 61 % (ref 43–77)

## 2011-07-11 MED ORDER — ONDANSETRON HCL 4 MG/2ML IJ SOLN
4.0000 mg | Freq: Once | INTRAMUSCULAR | Status: AC
  Administered 2011-07-11: 4 mg via INTRAVENOUS
  Filled 2011-07-11: qty 2

## 2011-07-11 MED ORDER — OXYCODONE-ACETAMINOPHEN 5-325 MG PO TABS: 1.0000 | ORAL_TABLET | Freq: Once | ORAL | Status: DC

## 2011-07-11 MED ORDER — SODIUM CHLORIDE 0.9 % IV BOLUS (SEPSIS)
1000.0000 mL | Freq: Once | INTRAVENOUS | Status: AC
  Administered 2011-07-11: 1000 mL via INTRAVENOUS

## 2011-07-11 MED ORDER — HYDROMORPHONE HCL PF 1 MG/ML IJ SOLN
1.0000 mg | Freq: Once | INTRAMUSCULAR | Status: AC
  Administered 2011-07-11: 1 mg via INTRAVENOUS
  Filled 2011-07-11: qty 1

## 2011-07-11 MED ORDER — METOCLOPRAMIDE HCL 5 MG/ML IJ SOLN
10.0000 mg | Freq: Once | INTRAMUSCULAR | Status: AC
  Administered 2011-07-11: 10 mg via INTRAVENOUS
  Filled 2011-07-11: qty 2

## 2011-07-11 MED ORDER — LIDOCAINE-EPINEPHRINE 2 %-1:100000 IJ SOLN
20.0000 mL | Freq: Once | INTRAMUSCULAR | Status: AC
  Administered 2011-07-11: 20 mL

## 2011-07-11 MED ORDER — DIPHENHYDRAMINE HCL 50 MG/ML IJ SOLN
25.0000 mg | Freq: Once | INTRAMUSCULAR | Status: AC
  Administered 2011-07-11: 25 mg via INTRAVENOUS
  Filled 2011-07-11: qty 1

## 2011-07-11 NOTE — ED Notes (Signed)
YQI:[id number]<QV> Expected date:07/11/11<BR> Expected time: 1:08 PM<BR> Means of arrival:Ambulance<BR> Comments:<BR> EMS 20 GC- 36 y/o female with migraines.

## 2011-07-11 NOTE — ED Notes (Signed)
MD performing lumbar puncture at bedside.

## 2011-07-11 NOTE — ED Notes (Signed)
Pt states that she was at [facility] this afternoon for HA. Pt given IV medications and Fluid with no relief from HA. Pt back to see if she can get relief from her migraine.

## 2011-07-11 NOTE — Discharge Instructions (Signed)

## 2011-07-11 NOTE — ED Notes (Signed)
Patient complaining of a migraine that started yesterday evening; patient states that she left [facility] several hours ago and that her symptoms have not improved -- patient had CT; was not given any prescriptions.  Reports light sensitivity; denies blurred vision, nausea, and vomiting.

## 2011-07-11 NOTE — ED Notes (Signed)
MD at bedside. 

## 2011-07-11 NOTE — ED Provider Notes (Signed)
History     CSN: [id number]  Arrival date & time 07/11/11  1314   First MD Initiated Contact with Patient 07/11/11 1323      Chief Complaint  Patient presents with  . Headache    (Consider location/radiation/quality/duration/timing/severity/associated sxs/prior treatment) Patient is a 36 y.o. female presenting with headaches. The history is provided by the patient.  Headache  This is a new problem. The current episode started yesterday. The problem occurs constantly. The problem has not changed since onset.The headache is associated with bright light and loud noise. The pain is located in the frontal (Started in her neck and has moved to the front of her head) region. The quality of the pain is described as throbbing and sharp. The pain is at a severity of 10/10. The pain is severe. The pain does not radiate. Associated symptoms include anorexia and nausea. Pertinent negatives include no fever, no chest pressure, no shortness of breath and no vomiting. She has tried acetaminophen for the symptoms. The treatment provided no relief.    Past Medical History  Diagnosis Date  . Hypertension   . Diabetes mellitus     Past Surgical History  Procedure Date  . Cesarean section   . Cesarean section x 5    No family history on file.  History  Substance Use Topics  . Smoking status: Former Games developer  . Smokeless tobacco: Not on file  . Alcohol Use: No    OB History    Grav Para Term Preterm Abortions TAB SAB Ect Mult Living                  Review of Systems  Constitutional: Negative for fever.  Respiratory: Negative for shortness of breath.   Gastrointestinal: Positive for nausea and anorexia. Negative for vomiting.  Neurological: Positive for headaches.  All other systems reviewed and are negative.    Allergies  Sulfa antibiotics  Home Medications   Current Outpatient Rx  Name Route Sig Dispense Refill  . INSULIN GLARGINE 100 UNIT/ML [place] SOLN Subcutaneous Inject 34  Units into the skin at bedtime.      . INSULIN GLARGINE 100 UNIT/ML [place] SOLN Subcutaneous Inject 34 Units into the skin at bedtime. 10 mL 0  . INSULIN LISPRO (HUMAN) 100 UNIT/ML [place] SOLN Subcutaneous Inject 22 Units into the skin 2 (two) times daily. Breakfast and lunch.     . INSULIN LISPRO (HUMAN) 100 UNIT/ML  SOLN Subcutaneous Inject 22 Units into the skin 2 (two) times daily with breakfast and lunch. 10 mL 0    There were no vitals taken for this visit.  Physical Exam  Nursing note and vitals reviewed. Constitutional: She is oriented to person, place, and time. She appears well-developed and well-nourished. She appears distressed.  HENT:  Head: Normocephalic and atraumatic.  Mouth/Throat: Oropharynx is clear and moist.  Eyes: EOM are normal. Pupils are equal, round, and reactive to light.       Photophobia   Neck: Spinous process tenderness and muscular tenderness present. No rigidity. No Brudzinski's sign and no Kernig's sign noted.  Cardiovascular: Normal rate, regular rhythm, normal heart sounds and intact distal pulses.  Exam reveals no friction rub.   No murmur heard. Pulmonary/Chest: Effort normal and breath sounds normal. She has no wheezes. She has no rales.  Abdominal: Soft. Bowel sounds are normal. She exhibits no distension. There is no tenderness. There is no rebound and no guarding.  Musculoskeletal: Normal range of motion. She exhibits no tenderness.  No edema  Neurological: She is alert and oriented to person, place, and time. She has normal strength. No cranial nerve deficit or sensory deficit. Coordination and gait normal. GCS eye subscore is 4. GCS verbal subscore is 5. GCS motor subscore is 6.  Skin: Skin is warm. No rash noted. She is diaphoretic.  Psychiatric: She has a normal mood and affect. Her behavior is normal.    ED Course  Procedures (including critical care time)   Labs Reviewed  CBC  DIFFERENTIAL  BASIC METABOLIC PANEL   Ct Head Wo  Contrast  07/11/2011  *RADIOLOGY REPORT*  Clinical Data:  Headache  CT HEAD WITHOUT CONTRAST  Technique:  Contiguous axial images were obtained from the base of the skull through the vertex without contrast  Comparison:  07/05/2009  Findings:  The brain has a normal appearance without evidence for hemorrhage, acute infarction, hydrocephalus, or mass lesion.  There is no extra axial fluid collection.  The skull and paranasal sinuses are normal.  IMPRESSION: Normal CT of the head without contrast.  Original Report Authenticated By: Camelia Phenes, M.D.     No diagnosis found.    MDM   Headache today that started suddenly last night. It started in her neck and this moved to the front of her head. She did not have a history of headaches and states she's never had a headache like this before.  Concern for possible subarachnoid hemorrhage versus infectious etiology. Patient did not have meningeal signs on exam but is tender with palpation of the neck. Especially with being diabetic with be concerned about an indolent infection. Patient has no neuro deficits on exam. Will treat her pain. Head CT, CBC and BMP pending. Discussed with patient the need for LP in the future to rule out infection or bleeding. She is agreeable to this plan. She is not on any hormone supplements, Roberta control and has not recently been pregnant and feel a cavernous venous thrombosis would be less likely.  CT and labs wnl.  Pt feeling better after headache cocktail.  Multiple attempts at LP unsuccessfull and pt states she will take her chances and does not want anymore attempts and does not want radiology to do it.  She will return if HA worsening and wants to go home.  She knows that she is not r/o for bleed or infection and understands her risks.      Gwyneth Sprout, MD 07/11/11 1541

## 2011-07-11 NOTE — ED Notes (Signed)
Pt. Started with pain at the base of her skull last night and pain progressed through the night and now is c/o pain in frontal area of head.  No prior history of migraines.

## 2011-07-12 MED ORDER — PROMETHAZINE HCL 25 MG/ML IJ SOLN
25.0000 mg | Freq: Once | INTRAMUSCULAR | Status: AC
  Administered 2011-07-12: 25 mg via INTRAMUSCULAR
  Filled 2011-07-12: qty 1

## 2011-07-12 MED ORDER — HYDROMORPHONE HCL PF 2 MG/ML IJ SOLN
2.0000 mg | Freq: Once | INTRAMUSCULAR | Status: AC
  Administered 2011-07-12: 2 mg via INTRAMUSCULAR
  Filled 2011-07-12: qty 1

## 2011-07-12 MED ORDER — OXYCODONE-ACETAMINOPHEN 5-325 MG PO TABS: 1.0000 | ORAL_TABLET | Freq: Four times a day (QID) | ORAL | Status: AC | PRN

## 2011-07-12 NOTE — ED Notes (Signed)
Per PA, patient had two unsuccessful lumbar puncture attempts at [facility] today; patient needing a lumbar puncture here -- acuity changed from 4 to 3; patient to move to exam room in back.

## 2011-07-12 NOTE — ED Provider Notes (Addendum)
History     CSN: [id number]  Arrival date & time 07/11/11  2228   First MD Initiated Contact with Patient 07/11/11 2340      Chief Complaint  Patient presents with  . Migraine    (Consider location/radiation/quality/duration/timing/severity/associated sxs/prior treatment) HPI Comments: Seen earlier today at [facility], had CT, attempt at LP, meds given but no relief.  She presents here with continued headache, nausea.  No fevers.    Patient is a 36 y.o. female presenting with migraine. The history is provided by the patient.  Migraine This is a new problem. The current episode started yesterday. The problem occurs constantly. The problem has not changed since onset.The symptoms are aggravated by nothing. The symptoms are relieved by nothing. Treatments tried: nsaids, meds at [facility]. The treatment provided mild relief.    Past Medical History  Diagnosis Date  . Hypertension   . Diabetes mellitus     Past Surgical History  Procedure Date  . Cesarean section   . Cesarean section x 5    History reviewed. No pertinent family history.  History  Substance Use Topics  . Smoking status: Former Games developer  . Smokeless tobacco: Not on file  . Alcohol Use: No    OB History    Grav Para Term Preterm Abortions TAB SAB Ect Mult Living                  Review of Systems  All other systems reviewed and are negative.    Allergies  Sulfa antibiotics  Home Medications   Current Outpatient Rx  Name Route Sig Dispense Refill  . INSULIN GLARGINE 100 UNIT/ML [place] SOLN Subcutaneous Inject 34 Units into the skin at bedtime.     . INSULIN LISPRO (HUMAN) 100 UNIT/ML [place] SOLN Subcutaneous Inject 29 Units into the skin 2 (two) times daily. Breakfast and lunch.    [place] LISINOPRIL 10 MG PO TABS Oral Take 10 mg by mouth daily.    . TRAMADOL HCL 50 MG PO TABS Oral Take 50 mg by mouth every 6 (six) hours as needed. pain      BP 141/92  Temp(Src) 98.3 F (36.8 C) (Oral)  Resp 18  SpO2 97%  LMP  07/01/2011  Physical Exam  Nursing note and vitals reviewed. Constitutional: She is oriented to person, place, and time. She appears well-developed and well-nourished. No distress.  HENT:  Head: Normocephalic and atraumatic.  Right Ear: External ear normal.  Left Ear: External ear normal.  Mouth/Throat: Oropharynx is clear and moist.  Eyes: EOM are normal. Pupils are equal, round, and reactive to light.       No papilledema  Neck: Normal range of motion. Neck supple.  Cardiovascular: Normal rate and regular rhythm.   Pulmonary/Chest: Effort normal.  Musculoskeletal: Normal range of motion. She exhibits no edema.  Neurological: She is alert and oriented to person, place, and time. No cranial nerve deficit. She exhibits normal muscle tone. Coordination normal.  Skin: Skin is warm and dry. She is not diaphoretic.    ED Course  Procedures (including critical care time)  Labs Reviewed - No data to display Ct Head Wo Contrast  07/11/2011  *RADIOLOGY REPORT*  Clinical Data:  Headache  CT HEAD WITHOUT CONTRAST  Technique:  Contiguous axial images were obtained from the base of the skull through the vertex without contrast  Comparison:  07/05/2009  Findings:  The brain has a normal appearance without evidence for hemorrhage, acute infarction, hydrocephalus, or mass lesion.  There  is no extra axial fluid collection.  The skull and paranasal sinuses are normal.  IMPRESSION: Normal CT of the head without contrast.  Original Report Authenticated By: Camelia Phenes, M.D.     No diagnosis found.    MDM  Feels better with meds given.  Will prescribe pain medications, to return prn if worsens or does not improve.        Geoffery Lyons, MD 07/12/11 4098  Geoffery Lyons, MD 08/15/11 [phone]

## 2011-07-12 NOTE — ED Notes (Signed)
Pt ambulated with a steady gait;VSS; A&Ox3; no signs of distress; respirations even and unlabored; skin warm and dry; no questions at this time.  

## 2011-07-12 NOTE — ED Notes (Signed)
Patient reported migrain headache. She stated she was nauseated earlier, but denies any nausea now.

## 2012-09-09 ENCOUNTER — Encounter (HOSPITAL_COMMUNITY): Payer: Self-pay | Admitting: *Deleted

## 2012-09-09 ENCOUNTER — Inpatient Hospital Stay (HOSPITAL_COMMUNITY)
Admission: AD | Admit: 2012-09-09 | Discharge: 2012-09-09 | Disposition: A | Discharge status: Home / Self Care | Payer: Self-pay | Source: Ambulatory Visit | Attending: Obstetrics & Gynecology | Admitting: Obstetrics & Gynecology

## 2012-09-09 ENCOUNTER — Inpatient Hospital Stay (HOSPITAL_COMMUNITY): Payer: Self-pay

## 2012-09-09 DIAGNOSIS — R109 Unspecified abdominal pain: Secondary | ICD-10-CM | POA: Insufficient documentation

## 2012-09-09 DIAGNOSIS — N949 Unspecified condition associated with female genital organs and menstrual cycle: Secondary | ICD-10-CM

## 2012-09-09 DIAGNOSIS — E119 Type 2 diabetes mellitus without complications: Secondary | ICD-10-CM | POA: Insufficient documentation

## 2012-09-09 DIAGNOSIS — A499 Bacterial infection, unspecified: Secondary | ICD-10-CM | POA: Insufficient documentation

## 2012-09-09 DIAGNOSIS — N760 Acute vaginitis: Secondary | ICD-10-CM | POA: Insufficient documentation

## 2012-09-09 DIAGNOSIS — R102 Pelvic and perineal pain: Secondary | ICD-10-CM

## 2012-09-09 DIAGNOSIS — I10 Essential (primary) hypertension: Secondary | ICD-10-CM | POA: Insufficient documentation

## 2012-09-09 DIAGNOSIS — B9689 Other specified bacterial agents as the cause of diseases classified elsewhere: Secondary | ICD-10-CM | POA: Insufficient documentation

## 2012-09-09 LAB — URINALYSIS, ROUTINE W REFLEX MICROSCOPIC
Glucose, UA: 500 mg/dL — AB
Hgb urine dipstick: NEGATIVE
Specific Gravity, Urine: 1.025 (ref 1.005–1.030)

## 2012-09-09 LAB — WET PREP, GENITAL: Trich, Wet Prep: NONE SEEN

## 2012-09-09 MED ORDER — NAPROXEN SODIUM 550 MG PO TABS: 550.0000 mg | ORAL_TABLET | Freq: Two times a day (BID) | ORAL | Status: DC

## 2012-09-09 MED ORDER — METRONIDAZOLE 500 MG PO TABS: 500.0000 mg | ORAL_TABLET | Freq: Two times a day (BID) | ORAL | Status: DC

## 2012-09-09 MED ORDER — OXYCODONE-ACETAMINOPHEN 5-325 MG PO TABS: 1.0000 | ORAL_TABLET | ORAL | Status: DC | PRN

## 2012-09-09 MED ORDER — OXYCODONE-ACETAMINOPHEN 5-325 MG PO TABS
2.0000 | ORAL_TABLET | Freq: Once | ORAL | Status: AC
  Administered 2012-09-09: 2 via ORAL
  Filled 2012-09-09: qty 2

## 2012-09-09 NOTE — MAU Note (Signed)
States ovarian cysts come and go every 2-3 months and this feels like that and is really bad.

## 2012-09-09 NOTE — MAU Provider Note (Signed)
History     CSN: [id number]  Arrival date and time: 09/09/12 1338   First Provider Initiated Contact with Patient 09/09/12 1422      Chief Complaint  Patient presents with  . Abdominal Pain   HPI 37 y.o. [id number] with pelvic pain x 3 days, worsening, states she has had pain like this in the past when she had an ovarian cyst. + vaginal discharge, no bleeding.    Past Medical History  Diagnosis Date  . Hypertension   . Diabetes mellitus   . Preterm delivery     Past Surgical History  Procedure Laterality Date  . Tubal ligation    . Laparoscopic lysis of adhesions    . Cesarean section    . Cesarean section  x 5    History reviewed. No pertinent family history.  History  Substance Use Topics  . Smoking status: Current Every Day Smoker -- 1.00 packs/day    Types: Cigarettes  . Smokeless tobacco: Not on file  . Alcohol Use: No    Allergies:  Allergies  Allergen Reactions  . Sulfa Antibiotics Anaphylaxis    No prescriptions prior to admission    Review of Systems  Constitutional: Negative.  Negative for fever and chills.  Respiratory: Negative.   Cardiovascular: Negative.   Gastrointestinal: Positive for nausea. Negative for vomiting, abdominal pain, diarrhea and constipation.  Genitourinary: Negative for dysuria, urgency, frequency, hematuria and flank pain.       Negative for vaginal bleeding, + discharge and pain   Musculoskeletal: Negative.   Neurological: Negative.   Psychiatric/Behavioral: Negative.    Physical Exam   Blood pressure 138/87, pulse 90, temperature 98.6 F (37 C), temperature source Oral, resp. rate 18, height 6' (1.829 m), weight 240 lb (108.863 kg), last menstrual period 08/25/2012.  Physical Exam  Nursing note and vitals reviewed. Constitutional: She is oriented to person, place, and time. She appears well-developed and well-nourished. No distress.  Obese   Cardiovascular: Normal rate.   Respiratory: Effort normal.  GI: Soft.  There is no tenderness.  Genitourinary: There is no rash, tenderness or lesion on the right labia. There is no rash, tenderness or lesion on the left labia. Uterus is not enlarged and not tender. Cervix exhibits motion tenderness. Cervix exhibits no discharge and no friability. Right adnexum displays tenderness. Right adnexum displays no mass and no fullness. Left adnexum displays tenderness. Left adnexum displays no mass and no fullness. No erythema or bleeding around the vagina. Vaginal discharge (thin, malodorous) found.  Musculoskeletal: Normal range of motion.  Neurological: She is alert and oriented to person, place, and time.  Skin: Skin is warm and dry.  Psychiatric: She has a normal mood and affect.    MAU Course  Procedures  Results for orders placed during the hospital encounter of 09/09/12 (from the past 24 hour(s))  URINALYSIS, ROUTINE W REFLEX MICROSCOPIC     Status: Abnormal   Collection Time    09/09/12  1:45 PM      Result Value Range   Color, Urine YELLOW  YELLOW   APPearance HAZY (*) CLEAR   Specific Gravity, Urine 1.025  1.005 - 1.030   pH 5.5  5.0 - 8.0   Glucose, UA 500 (*) NEGATIVE mg/dL   Hgb urine dipstick NEGATIVE  NEGATIVE   Bilirubin Urine NEGATIVE  NEGATIVE   Ketones, ur NEGATIVE  NEGATIVE mg/dL   Protein, ur NEGATIVE  NEGATIVE mg/dL   Urobilinogen, UA 0.2  0.0 - 1.0 mg/dL  Nitrite NEGATIVE  NEGATIVE   Leukocytes, UA NEGATIVE  NEGATIVE  POCT PREGNANCY, URINE     Status: None   Collection Time    09/09/12  2:00 PM      Result Value Range   Preg Test, Ur NEGATIVE  NEGATIVE  WET PREP, GENITAL     Status: Abnormal   Collection Time    09/09/12  2:25 PM      Result Value Range   Yeast Wet Prep HPF POC NONE SEEN  NONE SEEN   Trich, Wet Prep NONE SEEN  NONE SEEN   Clue Cells Wet Prep HPF POC FEW (*) NONE SEEN   WBC, Wet Prep HPF POC FEW (*) NONE SEEN   US Transvaginal Non-ob  09/09/2012  *RADIOLOGY REPORT*  Clinical Data: Right lower quadrant pelvic  pain.  History of ovarian cyst.  LMP 08/25/2012  TRANSABDOMINAL AND TRANSVAGINAL ULTRASOUND OF PELVIS Technique:  Both transabdominal and transvaginal ultrasound examinations of the pelvis were performed. Transabdominal technique was performed for global imaging of the pelvis including uterus, ovaries, adnexal regions, and pelvic cul-de-sac.  It was necessary to proceed with endovaginal exam following the transabdominal exam to visualize the myometrium, endometrium and adnexa.  Comparison:  None  Findings:  Uterus: Is anteverted and anteflexed and demonstrates a sagittal length of 11 centimeters, depth of 6.2 cm and width of 6.9 cm.  A homogeneous myometrium is seen.  A myometrial defect is identified at the site of the prior C-section and a small amount of fluid is located within the defect site.  Endometrium: Appears trilayered with a width of 15 mm.  No areas of focal thickening or heterogeneity are seen and this would correlate with a periovulatory endometrial stripe and correspond with the provided LMP of 08/25/2012  Right ovary:  Has a normal appearance measuring 3.6 x 2.4 x 2.1 cm  Left ovary: Has a normal appearance measuring 3.5 x 2.4 x 2.3 cm  Other findings: No pelvic fluid or separate adnexal masses are seen.  IMPRESSION: Unremarkable periovulatory pelvic ultrasound.  Incidental note is made of a myometrial defect in the region of the patient's C-section scar and this contains a small amount of fluid.   Original Report Authenticated By: Rhodia Albright, M.D.    US Pelvis Complete  09/09/2012  *RADIOLOGY REPORT*  Clinical Data: Right lower quadrant pelvic pain.  History of ovarian cyst.  LMP 08/25/2012  TRANSABDOMINAL AND TRANSVAGINAL ULTRASOUND OF PELVIS Technique:  Both transabdominal and transvaginal ultrasound examinations of the pelvis were performed. Transabdominal technique was performed for global imaging of the pelvis including uterus, ovaries, adnexal regions, and pelvic cul-de-sac.  It was  necessary to proceed with endovaginal exam following the transabdominal exam to visualize the myometrium, endometrium and adnexa.  Comparison:  None  Findings:  Uterus: Is anteverted and anteflexed and demonstrates a sagittal length of 11 centimeters, depth of 6.2 cm and width of 6.9 cm.  A homogeneous myometrium is seen.  A myometrial defect is identified at the site of the prior C-section and a small amount of fluid is located within the defect site.  Endometrium: Appears trilayered with a width of 15 mm.  No areas of focal thickening or heterogeneity are seen and this would correlate with a periovulatory endometrial stripe and correspond with the provided LMP of 08/25/2012  Right ovary:  Has a normal appearance measuring 3.6 x 2.4 x 2.1 cm  Left ovary: Has a normal appearance measuring 3.5 x 2.4 x 2.3 cm  Other findings:  No pelvic fluid or separate adnexal masses are seen.  IMPRESSION: Unremarkable periovulatory pelvic ultrasound.  Incidental note is made of a myometrial defect in the region of the patient's C-section scar and this contains a small amount of fluid.   Original Report Authenticated By: Rhodia Albright, M.D.     Assessment and Plan   1. Pelvic pain in female   2. BV (bacterial vaginosis)   No cause for pain or emergent condition identifiable today. F/u at urgent care if pain persists. Info given on finding PCP, as patient does not have a provider managing her HTN and DM.     Medication List    STOP taking these medications       ALEVE PO  Replaced by:  naproxen sodium 550 MG tablet      TAKE these medications       insulin glargine 100 UNIT/ML injection  Commonly known as:  LANTUS  Inject 34 Units into the skin at bedtime.     metroNIDAZOLE 500 MG tablet  Commonly known as:  FLAGYL  Take 1 tablet (500 mg total) by mouth 2 (two) times daily.     naproxen sodium 550 MG tablet  Commonly known as:  ALEVE  Take 1 tablet (550 mg total) by mouth 2 (two) times daily with a  meal.     oxyCODONE-acetaminophen 5-325 MG per tablet  Commonly known as:  PERCOCET/ROXICET  Take 1 tablet by mouth every 4 (four) hours as needed for pain.            Follow-up Information   Follow up with [facility]. (if symptoms worsen or do not improve)    Contact information:   [address] [place] [address]         Georges Mouse 09/09/2012, 3:39 PM

## 2012-09-11 LAB — GC/CHLAMYDIA PROBE AMP
CT Probe RNA: NEGATIVE
GC Probe RNA: NEGATIVE

## 2013-06-07 ENCOUNTER — Encounter (HOSPITAL_COMMUNITY): Payer: Self-pay | Admitting: Emergency Medicine

## 2013-06-07 ENCOUNTER — Emergency Department (HOSPITAL_COMMUNITY)
Admission: EM | Admit: 2013-06-07 | Discharge: 2013-06-07 | Disposition: A | Discharge status: Home / Self Care | Payer: Self-pay | Attending: Emergency Medicine | Admitting: Emergency Medicine

## 2013-06-07 ENCOUNTER — Emergency Department (HOSPITAL_COMMUNITY): Payer: Self-pay

## 2013-06-07 DIAGNOSIS — Z794 Long term (current) use of insulin: Secondary | ICD-10-CM | POA: Insufficient documentation

## 2013-06-07 DIAGNOSIS — Y9301 Activity, walking, marching and hiking: Secondary | ICD-10-CM | POA: Insufficient documentation

## 2013-06-07 DIAGNOSIS — S20219A Contusion of unspecified front wall of thorax, initial encounter: Secondary | ICD-10-CM | POA: Insufficient documentation

## 2013-06-07 DIAGNOSIS — E119 Type 2 diabetes mellitus without complications: Secondary | ICD-10-CM | POA: Insufficient documentation

## 2013-06-07 DIAGNOSIS — X500XXA Overexertion from strenuous movement or load, initial encounter: Secondary | ICD-10-CM | POA: Insufficient documentation

## 2013-06-07 DIAGNOSIS — Z8751 Personal history of pre-term labor: Secondary | ICD-10-CM | POA: Insufficient documentation

## 2013-06-07 DIAGNOSIS — Z791 Long term (current) use of non-steroidal anti-inflammatories (NSAID): Secondary | ICD-10-CM | POA: Insufficient documentation

## 2013-06-07 DIAGNOSIS — I10 Essential (primary) hypertension: Secondary | ICD-10-CM | POA: Insufficient documentation

## 2013-06-07 DIAGNOSIS — S20212A Contusion of left front wall of thorax, initial encounter: Secondary | ICD-10-CM

## 2013-06-07 DIAGNOSIS — Z792 Long term (current) use of antibiotics: Secondary | ICD-10-CM | POA: Insufficient documentation

## 2013-06-07 DIAGNOSIS — W010XXA Fall on same level from slipping, tripping and stumbling without subsequent striking against object, initial encounter: Secondary | ICD-10-CM | POA: Insufficient documentation

## 2013-06-07 DIAGNOSIS — Y929 Unspecified place or not applicable: Secondary | ICD-10-CM | POA: Insufficient documentation

## 2013-06-07 DIAGNOSIS — F17200 Nicotine dependence, unspecified, uncomplicated: Secondary | ICD-10-CM | POA: Insufficient documentation

## 2013-06-07 MED ORDER — NAPROXEN 500 MG PO TABS: 500.0000 mg | ORAL_TABLET | Freq: Two times a day (BID) | ORAL | Status: AC

## 2013-06-07 MED ORDER — TRAMADOL HCL 50 MG PO TABS: 50.0000 mg | ORAL_TABLET | Freq: Four times a day (QID) | ORAL | Status: AC | PRN

## 2013-06-07 NOTE — Discharge Instructions (Signed)
Please read and follow all provided instructions.  Your diagnoses today include:  1. Contusion of rib on left side     Tests performed today include:  An x-ray of the affected area - does NOT show any broken bones or other lung injury  Vital signs. See below for your results today.   Medications prescribed:   Tramadol - narcotic-like pain medication  DO NOT drive or perform any activities that require you to be awake and alert because this medicine can make you drowsy.    Naproxen - anti-inflammatory pain medication  Do not exceed 500mg  naproxen every 12 hours, take with food  You have been prescribed an anti-inflammatory medication or NSAID. Take with food. Take smallest effective dose for the shortest duration needed for your pain. Stop taking if you experience stomach pain or vomiting.   Take any prescribed medications only as directed.  Home care instructions:   Follow any educational materials contained in this packet  Follow R.I.C.E. Protocol:  R - rest your injury   I  - use ice on injury without applying directly to skin  C - compress injury with bandage or splint  E - elevate the injury as much as possible  Follow-up instructions: Please follow-up with your primary care provider if you continue to have significant pain in 1 week.   If you do not have a primary care doctor -- see below for referral information.   Return instructions:   Please return if your toes are numb or tingling, appear gray or blue, or you have severe pain (also elevate leg and loosen splint or wrap if you were given one)  Please return to the Emergency Department if you experience worsening symptoms.   Please return if you have any other emergent concerns.  Additional Information:  Your vital signs today were: BP 126/96   Pulse 104   Temp(Src) 98.6 F (37 C) (Oral)   Resp 20   Ht 6' (1.829 m)   Wt 240 lb (108.863 kg)   BMI 32.54 kg/m2   SpO2 98%   LMP 05/30/2013 If your blood  pressure (BP) was elevated above 135/85 this visit, please have this repeated by your doctor within one month. --------------

## 2013-06-07 NOTE — ED Notes (Signed)
PT ambulated with baseline gait; VSS; A&Ox3; no signs of distress; respirations even and unlabored; skin warm and dry; no questions upon discharge.  

## 2013-06-07 NOTE — ED Notes (Signed)
Pt states when we had the ice she fell and has had issues on her left side with feeling something popping like she might have broke a rib. States she is also having problems with her BP being high

## 2013-06-07 NOTE — ED Provider Notes (Signed)
CSN: [id number]     Arrival date & time 06/07/13  1627 History   This chart was scribed for non-physician practitioner Renne Crigler, PA-C, working with Flint Melter, MD by Donne Anon, ED Scribe. This patient was seen in room TR08C/TR08C and the patient's care was started at 1656.   First MD Initiated Contact with Patient 06/07/13 1656     Chief Complaint  Patient presents with  . Fall  . rib pain     The history is provided by the patient. No language interpreter was used.   HPI Comments: Brittney Rice is a 38 y.o. female with hx of DM and HTN, who presents to the Emergency Department complaining of a fall which occurred 2 weeks ago. She states she was walking, slipped on ice and fell on her side and heard a pop. She took tylenol which provided relief of her pain. She denies LOC from the fall. She states 3 days ago she began experiencing gradual onset, waxing and waning, moderate left sided rib pain described as "popping." Deep breaths make the pain worse. She denies leg swelling, coughing up blood, SOB, or any other symptoms. She denies taking hormonal birth control, recent travel, or hx of DVT.    Past Medical History  Diagnosis Date  . Hypertension   . Diabetes mellitus   . Preterm delivery    Past Surgical History  Procedure Laterality Date  . Tubal ligation    . Laparoscopic lysis of adhesions    . Cesarean section    . Cesarean section  x 5   No family history on file. History  Substance Use Topics  . Smoking status: Current Every Day Smoker -- 0.50 packs/day    Types: Cigarettes  . Smokeless tobacco: Not on file  . Alcohol Use: No   OB History   Grav Para Term Preterm Abortions TAB SAB Ect Mult Living   8 6 2 4 2  2   5      Review of Systems  Respiratory: Negative for cough and shortness of breath.   Cardiovascular: Negative for leg swelling.  Musculoskeletal: Positive for arthralgias.    Allergies  Sulfa antibiotics  Home Medications   Current  Outpatient Rx  Name  Route  Sig  Dispense  Refill  . insulin glargine (LANTUS) 100 UNIT/ML injection   Subcutaneous   Inject 34 Units into the skin at bedtime.          . metroNIDAZOLE (FLAGYL) 500 MG tablet   Oral   Take 1 tablet (500 mg total) by mouth 2 (two) times daily.   14 tablet   0   . naproxen sodium (ALEVE) 550 MG tablet   Oral   Take 1 tablet (550 mg total) by mouth 2 (two) times daily with a meal.   60 tablet   0   . oxyCODONE-acetaminophen (PERCOCET/ROXICET) 5-325 MG per tablet   Oral   Take 1 tablet by mouth every 4 (four) hours as needed for pain.   15 tablet   0    BP 126/96  Pulse 104  Temp(Src) 98.6 F (37 C) (Oral)  Resp 20  Ht 6' (1.829 m)  Wt 240 lb (108.863 kg)  BMI 32.54 kg/m2  SpO2 98%  LMP 05/30/2013  Physical Exam  Nursing note and vitals reviewed. Constitutional: She appears well-developed and well-nourished. No distress.  HENT:  Head: Normocephalic and atraumatic.  Eyes: Conjunctivae are normal.  Neck: Neck supple. No tracheal deviation present.  Cardiovascular:  Normal rate.   Pulmonary/Chest: Effort normal and breath sounds normal. No respiratory distress. She has no wheezes. She has no rales.  Tender to palpation of lateral left ribs inferior to left breast. No bony deformity.   Musculoskeletal: Normal range of motion.  Neurological: She is alert.  Skin: Skin is warm and dry.  Psychiatric: She has a normal mood and affect. Her behavior is normal.    ED Course  Procedures (including critical care time) DIAGNOSTIC STUDIES: Oxygen Saturation is 98% on RA, normal by my interpretation.    COORDINATION OF CARE: 6:13 PM Discussed treatment plan with pt at bedside and pt agreed to plan. Discussed imaging Advised pt to rest. Will discharge home with Tramadol and Naproxen.   Labs Review Labs Reviewed - No data to display Imaging Review Dg Chest 2 View  06/07/2013   CLINICAL DATA:  Left-sided chest pain and shortness of breath.   EXAM: CHEST  2 VIEW  COMPARISON:  None.  FINDINGS: Heart size and pulmonary vascularity are normal and the lungs are clear. No acute osseous abnormality. Thoracolumbar scoliosis.  IMPRESSION: No acute abnormalities.   Electronically Signed   By: Geanie CooleyJim  Maxwell M.D.   On: 06/07/2013 17:55    EKG Interpretation   None      Vital signs reviewed and are as follows: Filed Vitals:   06/07/13 1835  BP: 133/99  Pulse: 100  Temp:   Resp: 18   Patient counseled on use of narcotic pain medications. Counseled not to combine these medications with others containing tylenol. Urged not to drink alcohol, drive, or perform any other activities that requires focus while taking these medications. The patient verbalizes understanding and agrees with the plan.  Patient was counseled on RICE protocol and told to rest injury, use ice for no longer than 15 minutes every hour, compress the area, and elevate above the level of their heart as much as possible to reduce swelling.  Questions answered.  Patient verbalized understanding.     MDM   1. Contusion of rib on left side    Rib contusion. Do not suspect PE given risk factors (HR 104 only) and presentation. Pain is reproducible with palpation. CXR neg, no pneumothorax.   I personally performed the services described in this documentation, which was scribed in my presence. The recorded information has been reviewed and is accurate.    Renne CriglerJoshua Ralphie Lovelady, PA-C 06/07/13 1905

## 2013-06-07 NOTE — ED Notes (Signed)
MD at bedside. 

## 2013-06-08 NOTE — ED Provider Notes (Signed)
Medical screening examination/treatment/procedure(s) were performed by non-physician practitioner and as supervising physician I was immediately available for consultation/collaboration.  Flint MelterElliott L Lorice Lafave, MD 06/08/13 [phone]

## 2014-03-12 ENCOUNTER — Encounter (HOSPITAL_COMMUNITY): Payer: Self-pay | Admitting: Emergency Medicine

## 2014-04-19 IMAGING — US US TRANSVAGINAL NON-OB
1 series · 13 of 25 positions shown · non-contrast
Comparison: None

CLINICAL DATA: Right lower quadrant pelvic pain.  History of
ovarian cyst.  LMP 08/25/2012



[Series 1: us pelvis complete · 13 of 53 slices shown]
[im 1/53]
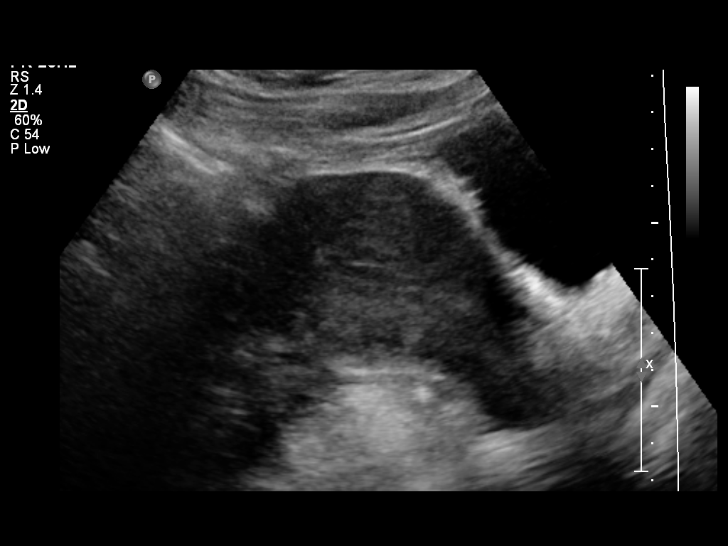
[im 5/53]
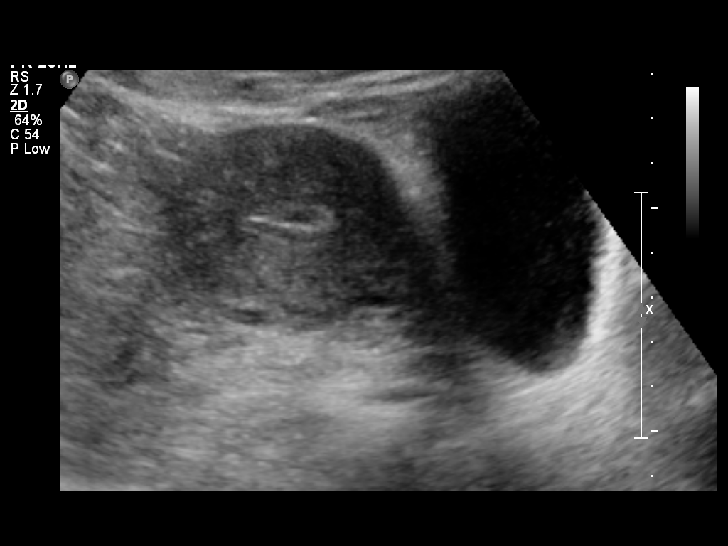
[im 9/53]
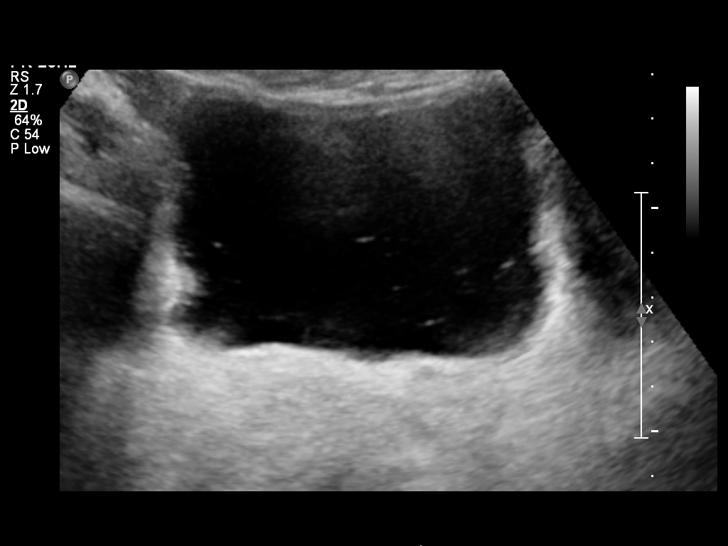
[im 14/53]
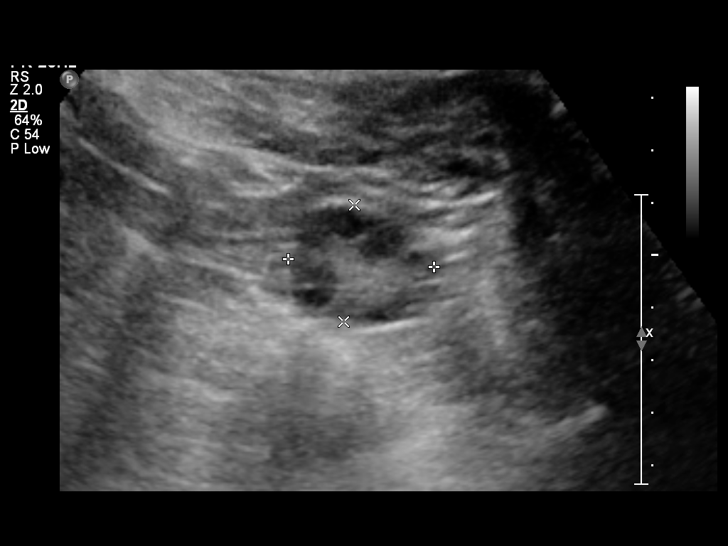
[im 18/53]
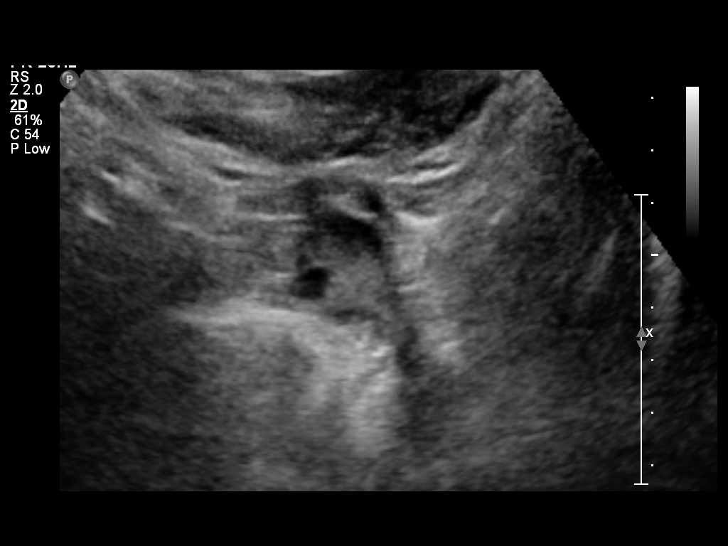
[im 22/53]
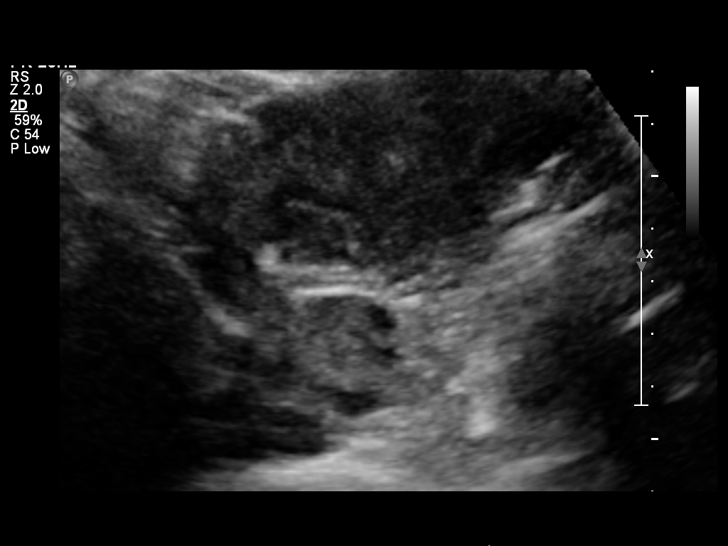
[im 27/53]
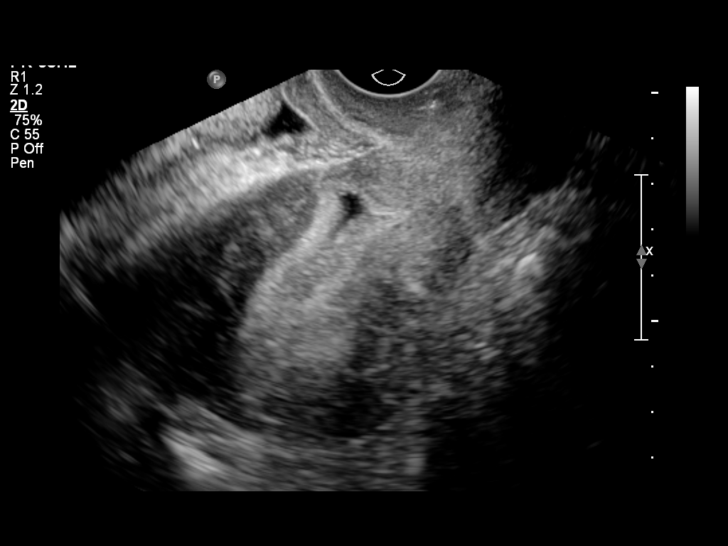
[im 31/53]
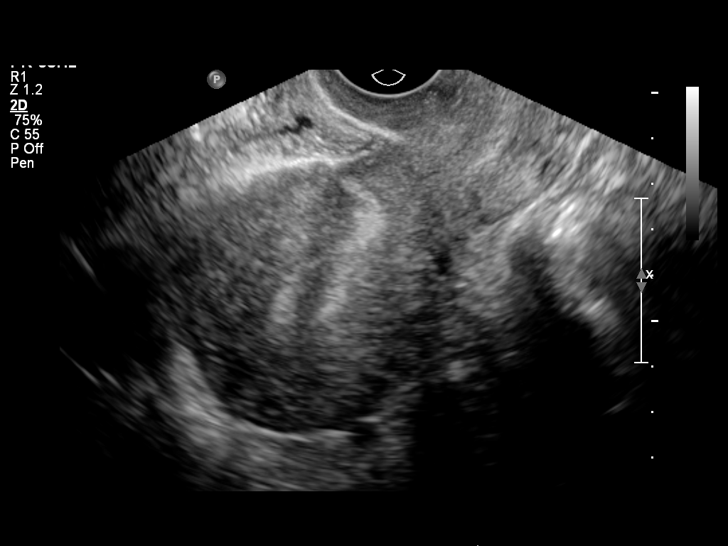
[im 35/53]
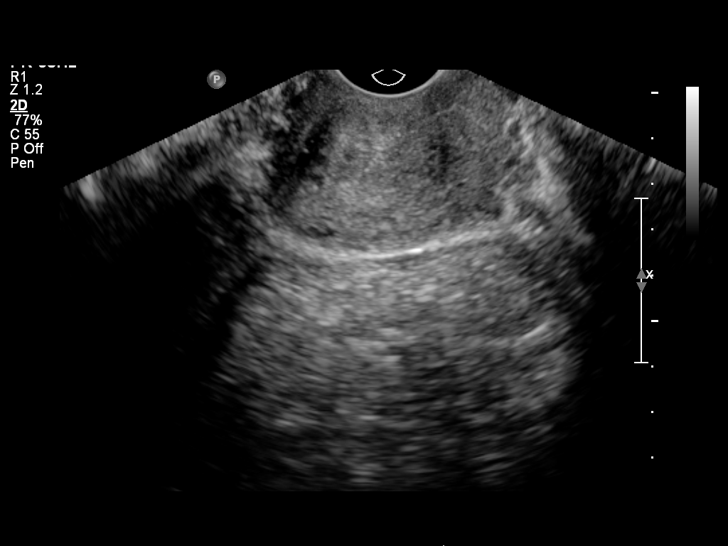
[im 40/53]
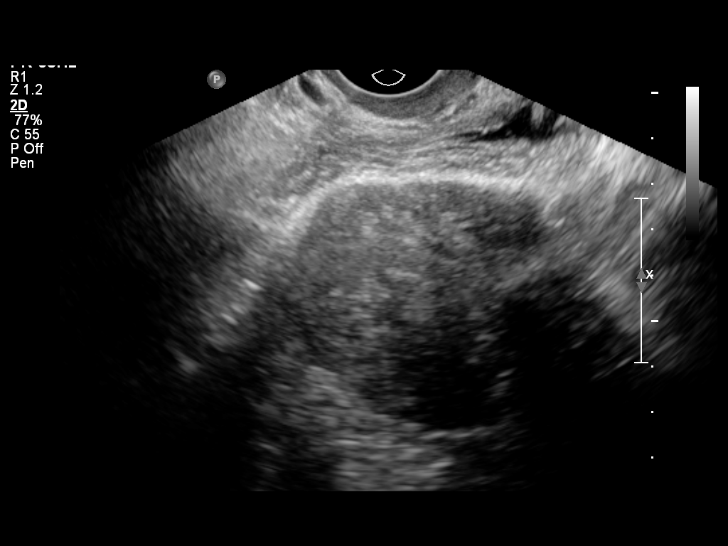
[im 44/53]
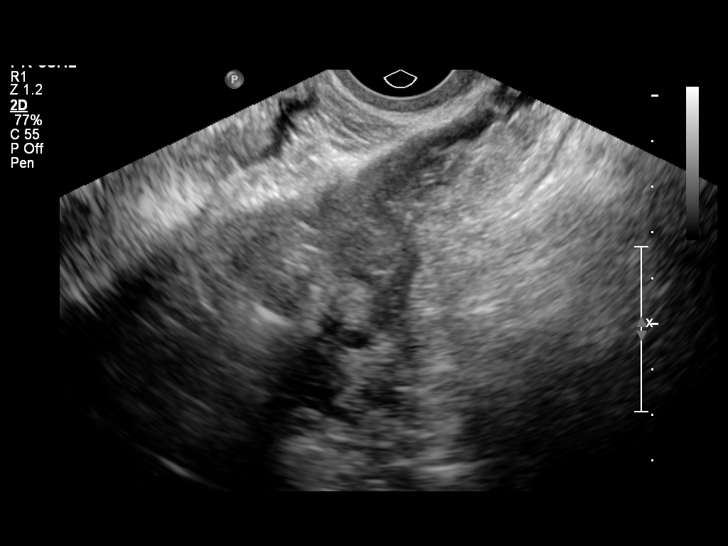
[im 48/53]
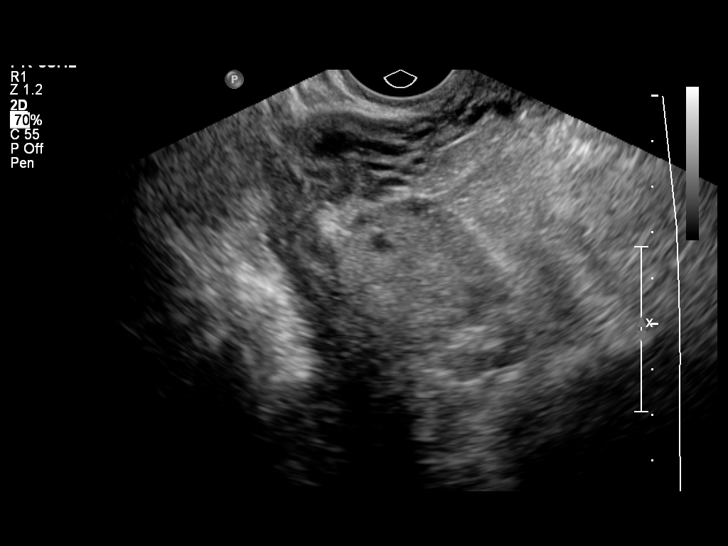
[im 53/53]
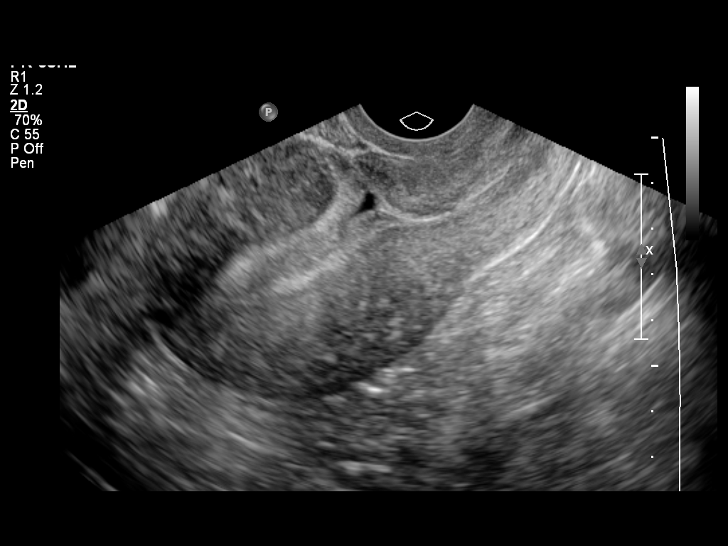

[13 of 25 positions shown; findings below may reference images not displayed]

FINDINGS: Uterus: Is anteverted and anteflexed and demonstrates a sagittal
length of 11 centimeters, depth of 6.2 cm and width of 6.9 cm.  A
homogeneous myometrium is seen.  A myometrial defect is identified
at the site of the prior C-section and a small amount of fluid is
located within the defect site.

Endometrium: Appears trilayered with a width of 15 mm.  No areas of
focal thickening or heterogeneity are seen and this would correlate
with a periovulatory endometrial stripe and correspond with the
provided LMP of 08/25/2012

Right ovary:  Has a normal appearance measuring 3.6 x 2.4 x 2.1 cm

Left ovary: Has a normal appearance measuring 3.5 x 2.4 x 2.3 cm

Other findings: No pelvic fluid or separate adnexal masses are
seen.
IMPRESSION: Unremarkable periovulatory pelvic ultrasound.

Incidental note is made of a myometrial defect in the region of the
patient's C-section scar and this contains a small amount of
fluid..

## 2014-05-26 ENCOUNTER — Encounter (HOSPITAL_COMMUNITY): Payer: Self-pay | Admitting: Emergency Medicine

## 2014-05-26 ENCOUNTER — Emergency Department (HOSPITAL_COMMUNITY)
Admission: EM | Admit: 2014-05-26 | Discharge: 2014-05-26 | Disposition: A | Discharge status: Home / Self Care | Payer: Medicaid Other | Attending: Emergency Medicine | Admitting: Emergency Medicine

## 2014-05-26 ENCOUNTER — Emergency Department (HOSPITAL_COMMUNITY): Payer: Medicaid Other

## 2014-05-26 DIAGNOSIS — I10 Essential (primary) hypertension: Secondary | ICD-10-CM | POA: Insufficient documentation

## 2014-05-26 DIAGNOSIS — Z791 Long term (current) use of non-steroidal anti-inflammatories (NSAID): Secondary | ICD-10-CM | POA: Insufficient documentation

## 2014-05-26 DIAGNOSIS — Y998 Other external cause status: Secondary | ICD-10-CM | POA: Insufficient documentation

## 2014-05-26 DIAGNOSIS — Z794 Long term (current) use of insulin: Secondary | ICD-10-CM | POA: Insufficient documentation

## 2014-05-26 DIAGNOSIS — Y9361 Activity, american tackle football: Secondary | ICD-10-CM | POA: Insufficient documentation

## 2014-05-26 DIAGNOSIS — Z720 Tobacco use: Secondary | ICD-10-CM | POA: Insufficient documentation

## 2014-05-26 DIAGNOSIS — E119 Type 2 diabetes mellitus without complications: Secondary | ICD-10-CM | POA: Insufficient documentation

## 2014-05-26 DIAGNOSIS — Y92321 Football field as the place of occurrence of the external cause: Secondary | ICD-10-CM | POA: Insufficient documentation

## 2014-05-26 DIAGNOSIS — W2101XA Struck by football, initial encounter: Secondary | ICD-10-CM | POA: Insufficient documentation

## 2014-05-26 DIAGNOSIS — S93602A Unspecified sprain of left foot, initial encounter: Secondary | ICD-10-CM

## 2014-05-26 NOTE — ED Notes (Signed)
No answer

## 2014-05-26 NOTE — Discharge Instructions (Signed)
Foot Sprain The muscles and cord like structures which attach muscle to bone (tendons) that surround the feet are made up of units. A foot sprain can occur at the weakest spot in any of these units. This condition is most often caused by injury to or overuse of the foot, as from playing contact sports, or aggravating a previous injury, or from poor conditioning, or obesity. SYMPTOMS  Pain with movement of the foot.  Tenderness and swelling at the injury site.  Loss of strength is present in moderate or severe sprains. THE THREE GRADES OR SEVERITY OF FOOT SPRAIN ARE:  Mild (Grade I): Slightly pulled muscle without tearing of muscle or tendon fibers or loss of strength.  Moderate (Grade II): Tearing of fibers in a muscle, tendon, or at the attachment to bone, with small decrease in strength.  Severe (Grade III): Rupture of the muscle-tendon-bone attachment, with separation of fibers. Severe sprain requires surgical repair. Often repeating (chronic) sprains are caused by overuse. Sudden (acute) sprains are caused by direct injury or over-use. DIAGNOSIS  Diagnosis of this condition is usually by your own observation. If problems continue, a caregiver may be required for further evaluation and treatment. X-rays may be required to make sure there are not breaks in the bones (fractures) present. Continued problems may require physical therapy for treatment. PREVENTION  Use strength and conditioning exercises appropriate for your sport.  Warm up properly prior to working out.  Use athletic shoes that are made for the sport you are participating in.  Allow adequate time for healing. Early return to activities makes repeat injury more likely, and can lead to an unstable arthritic foot that can result in prolonged disability. Mild sprains generally heal in 3 to 10 days, with moderate and severe sprains taking 2 to 10 weeks. Your caregiver can help you determine the proper time required for  healing. HOME CARE INSTRUCTIONS   Apply ice to the injury for 15-20 minutes, 03-04 times per day. Put the ice in a plastic bag and place a towel between the bag of ice and your skin.  An elastic wrap (like an Ace bandage) may be used to keep swelling down.  Keep foot above the level of the heart, or at least raised on a footstool, when swelling and pain are present.  Try to avoid use other than gentle range of motion while the foot is painful. Do not resume use until instructed by your caregiver. Then begin use gradually, not increasing use to the point of pain. If pain does develop, decrease use and continue the above measures, gradually increasing activities that do not cause discomfort, until you gradually achieve normal use.  Use crutches if and as instructed, and for the length of time instructed.  Keep injured foot and ankle wrapped between treatments.  Massage foot and ankle for comfort and to keep swelling down. Massage from the toes up towards the knee.  Only take over-the-counter or prescription medicines for pain, discomfort, or fever as directed by your caregiver. SEEK IMMEDIATE MEDICAL CARE IF:   Your pain and swelling increase, or pain is not controlled with medications.  You have loss of feeling in your foot or your foot turns cold or blue.  You develop new, unexplained symptoms, or an increase of the symptoms that brought you to your caregiver. MAKE SURE YOU:   Understand these instructions.  Will watch your condition.  Will get help right away if you are not doing well or get worse. Document Released:   10/17/2001 Document Revised: 07/20/2011 Document Reviewed: 12/15/2007 Exit[employer] Patient Information 2015 [employer]. This information is not intended to replace advice given to you by your health care provider. Make sure you discuss any questions you have with your health care provider.  

## 2014-05-26 NOTE — ED Notes (Signed)
Ortho tech coming to place post op boot.

## 2014-05-26 NOTE — ED Notes (Signed)
Patient here with complaint of left 5th toe pain secondary to playing football barefoot. States she believe the toe may have caught and bent underneath her foot. Left side of foot appears swollen proximal to toe. Tender to palpation.

## 2014-05-26 NOTE — ED Provider Notes (Signed)
CSN: [id number]     Arrival date & time 05/26/14  0049 History   First MD Initiated Contact with Patient 05/26/14 [phone]     Chief Complaint  Patient presents with  . Toe Pain    Patient is a 39 y.o. female presenting with toe pain. The history is provided by the patient.  Toe Pain This is a new problem. The current episode started yesterday. The problem occurs constantly. The problem has been gradually worsening. Exacerbated by: ambulation. The symptoms are relieved by rest.  pt reports injuring her left foot/toe after playing football barefoot Denies any other injury  Past Medical History  Diagnosis Date  . Hypertension   . Diabetes mellitus   . Preterm delivery    Past Surgical History  Procedure Laterality Date  . Tubal ligation    . Laparoscopic lysis of adhesions    . Cesarean section    . Cesarean section  x 5   History reviewed. No pertinent family history. History  Substance Use Topics  . Smoking status: Current Every Day Smoker -- 0.50 packs/day    Types: Cigarettes  . Smokeless tobacco: Not on file  . Alcohol Use: No   OB History    Gravida Para Term Preterm AB TAB SAB Ectopic Multiple Living   8 6 2 4 2  2   5      Review of Systems  Constitutional: Negative for fever.  Musculoskeletal: Positive for arthralgias.      Allergies  Sulfa antibiotics  Home Medications   Prior to Admission medications   Medication Sig Start Date End Date Taking? Authorizing Provider  insulin aspart (NOVOLOG) 100 UNIT/ML injection Inject 24-36 Units into the skin 3 (three) times daily before meals. 24 at breakfast, 20 units at lunch, 36 units in the evening    Historical Provider, MD  insulin glargine (LANTUS) 100 UNIT/ML injection Inject 22 Units into the skin at bedtime.     Historical Provider, MD  naproxen (NAPROSYN) 500 MG tablet Take 1 tablet (500 mg total) by mouth 2 (two) times daily. 06/07/13   Renne CriglerJoshua Geiple, PA-C  traMADol (ULTRAM) 50 MG tablet Take 1 tablet (50 mg  total) by mouth every 6 (six) hours as needed. 06/07/13   Renne CriglerJoshua Geiple, PA-C   BP 145/89 mmHg  Pulse 102  Temp(Src) 97.7 F (36.5 C) (Oral)  Resp 18  Ht 6' (1.829 m)  Wt 230 lb (104.327 kg)  BMI 31.19 kg/m2  SpO2 99%  LMP 05/26/2014 (Exact Date) Physical Exam CONSTITUTIONAL: Well developed/well nourished HEAD: Normocephalic/atraumatic EYES: EOMI ENMT: Mucous membranes moist NECK: supple no meningeal signs CV: S1/S2 noted, no murmurs/rubs/gallops noted LUNGS: Lungs are clear to auscultation bilaterally, no apparent distress NEURO: Pt is awake/alert/appropriate, moves all extremitiesx4.  No facial droop.   EXTREMITIES: pulses normal/equal, full ROM. Tenderness and mild swelling noted to lateral aspect of left foot.  No lacerations/abrasion/puncture wounds noted noted.  No ankle tenderness is noted SKIN: warm, color normal PSYCH: no abnormalities of mood noted, alert and oriented to situation  ED Course  Procedures   Pt with foot sprain, will place in post op shoe  Imaging Review Dg Foot Complete Left  05/26/2014   CLINICAL DATA:  Tripped into hole while playing football, with twisting of the left ankle. Left lateral foot pain. Initial encounter.  EXAM: LEFT FOOT - COMPLETE 3+ VIEW  COMPARISON:  None.  FINDINGS: There is no evidence of fracture or dislocation. The joint spaces are preserved. There is no evidence  of talar subluxation; the subtalar joint is unremarkable in appearance.  No significant soft tissue abnormalities are seen.  IMPRESSION: No evidence of fracture or dislocation.   Electronically Signed   By: Roanna Raider M.D.   On: 05/26/2014 02:16    MDM   Final diagnoses:  Sprain of left foot, initial encounter    Nursing notes including past medical history and social history reviewed and considered in documentation xrays/imaging reviewed by myself and considered during evaluation     Joya Gaskins, MD 05/26/14 0451

## 2016-01-03 IMAGING — CR DG FOOT COMPLETE 3+V*L*
3 series · 3 of 3 positions shown · non-contrast
Comparison: None.

CLINICAL DATA: Tripped into hole while playing football, with
twisting of the left ankle. Left lateral foot pain. Initial
encounter.

EXAM:
LEFT FOOT - COMPLETE 3+ VIEW

[foot ap]
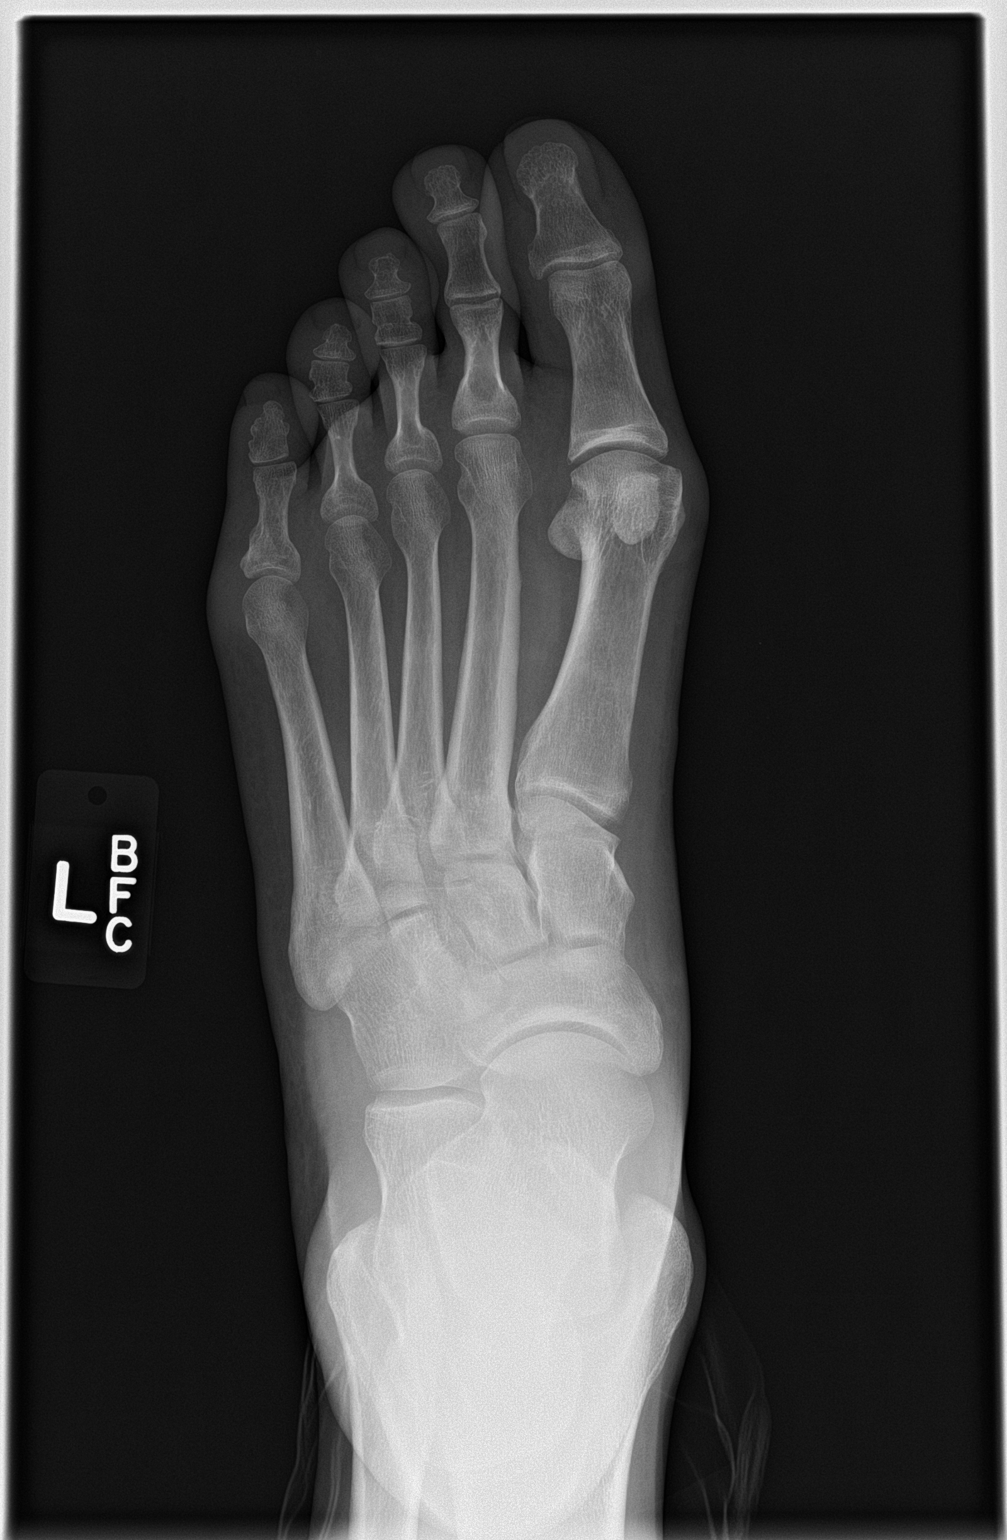

[foot obl]
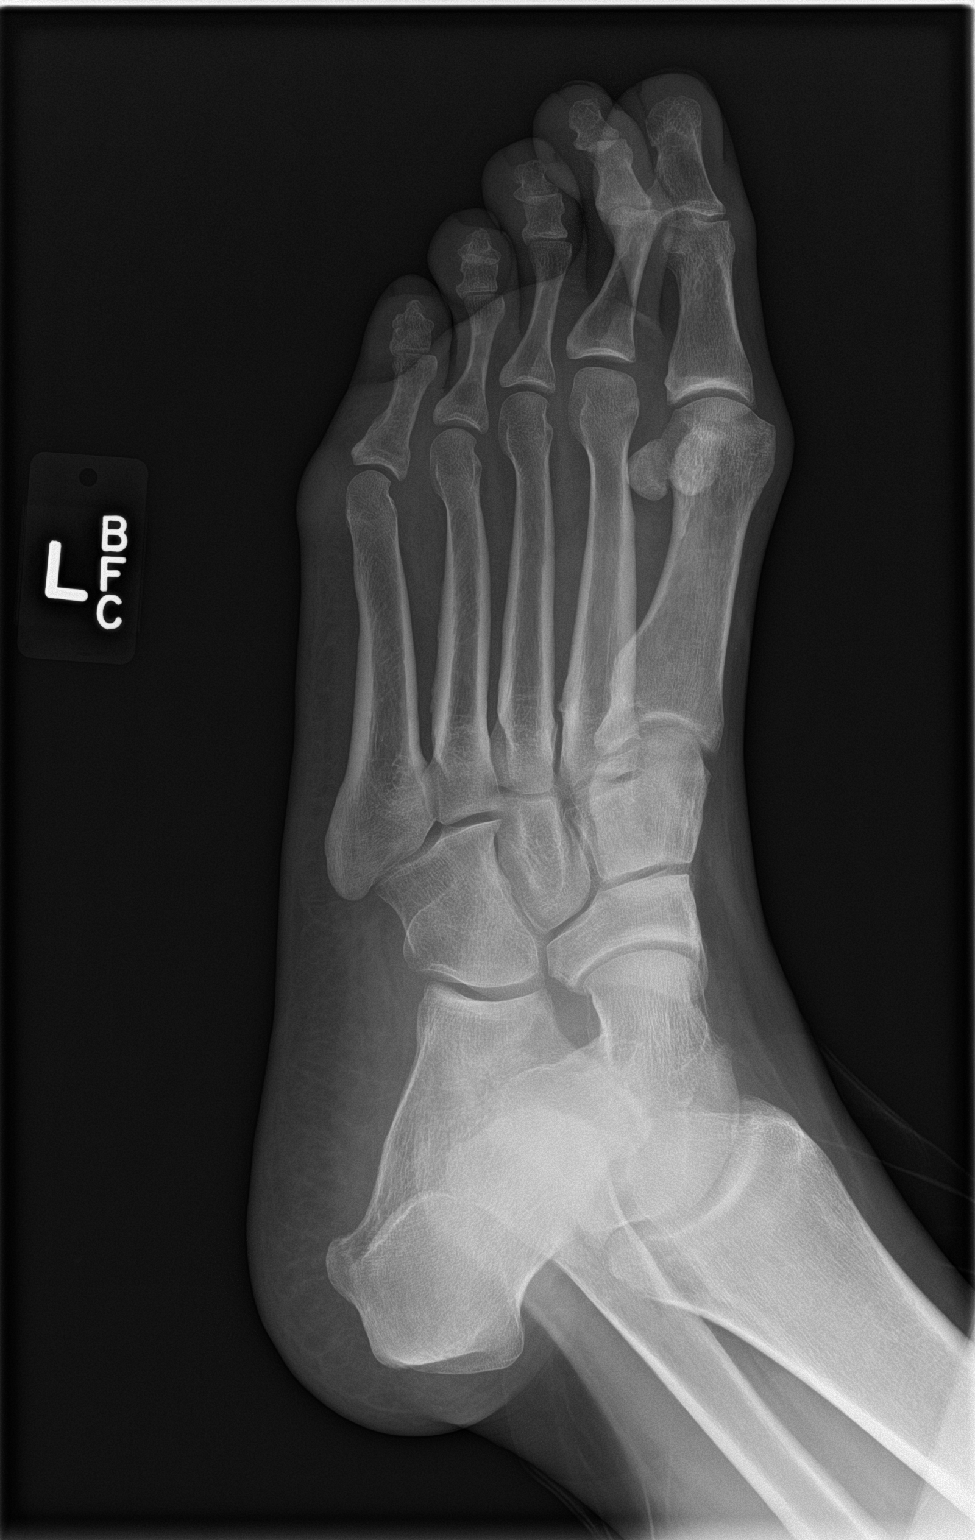

[foot lat]
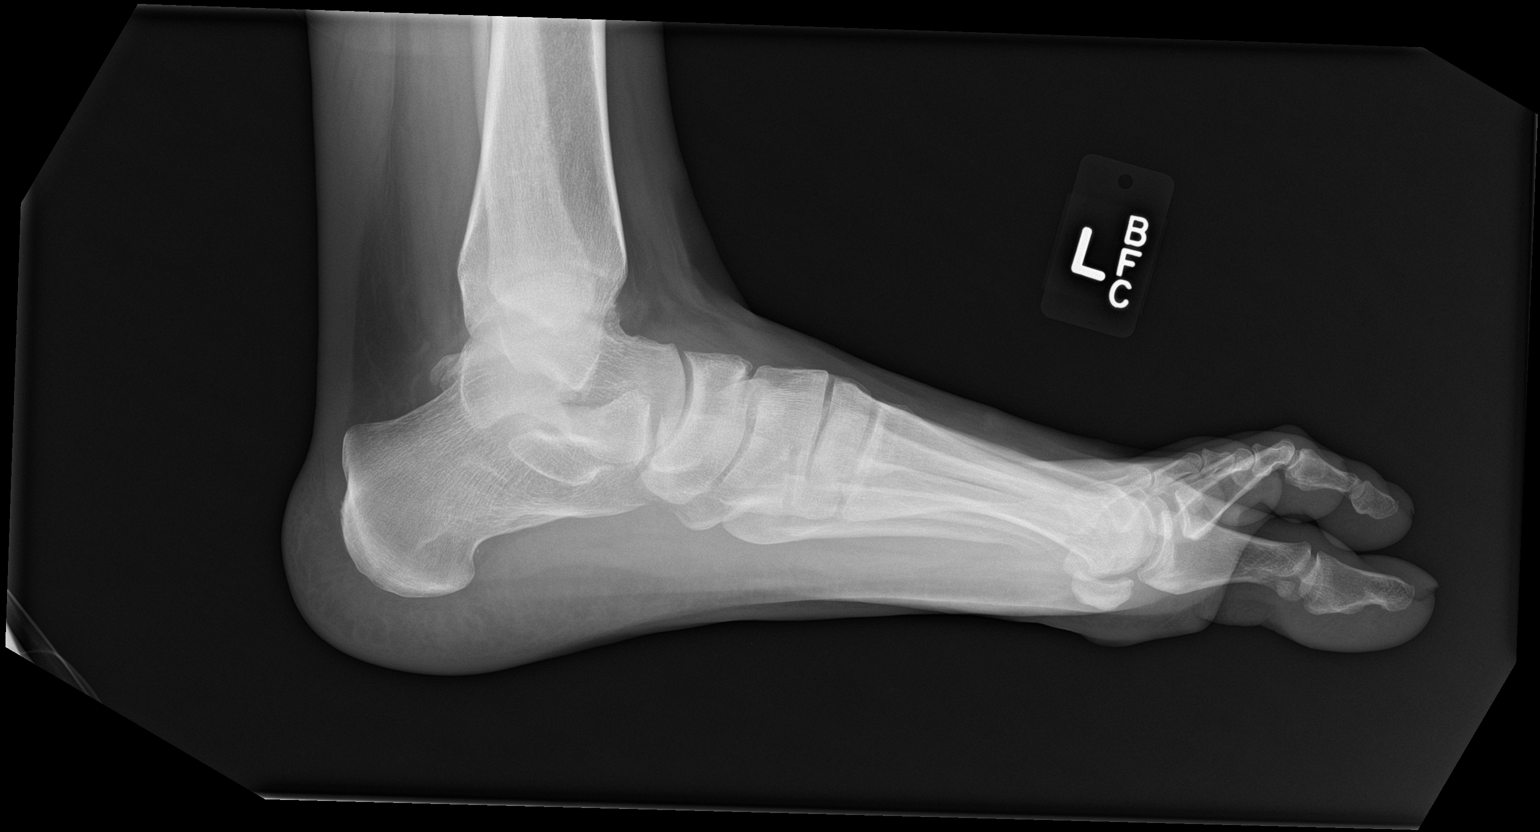

[3 of 3 positions shown; findings below may reference images not displayed]

FINDINGS: There is no evidence of fracture or dislocation. The joint spaces
are preserved. There is no evidence of talar subluxation; the
subtalar joint is unremarkable in appearance.

No significant soft tissue abnormalities are seen.
IMPRESSION: No evidence of fracture or dislocation.

## 2018-12-08 NOTE — Anesthesia Pre-Procedure Evaluation (Signed)
Formatting of this note is different from the original.  Images from the original note were not included.  Relevant Problems   ANESTHESIA (within normal limits)     PULMONARY   (+) Smoker     CARDIOVASCULAR (within normal limits)     GASTROINTESTINAL (within normal limits)     GU/Renal (within normal limits)     Endo   (+) Diabetes mellitus, type 2 (CMS/HCC)     Physical Exam    Airway  Mallampati: II  TM distance: >3 FB  Neck ROM: full  Patient's mouth opening is normal   Cardiovascular   ECG reviewed  Rhythm: regular  Rate: normal   Patient's exercise tolerance is >4 METS.   Her NYHA Classification is I.   Dental       Pulmonary - normal exam  Breath sounds clear to auscultation      Anesthesia Plan    ASA 3     general and regional   (Left PECS 1/2 Blocks)  intravenous induction   Anesthetic plan and risks discussed with patient.    Plan discussed with CRNA.  NPO status verified  QI NUMBER: [id number]      Electronically signed by Ledora Bottcher, MD at 12/26/2018  8:10 AM EDT

## 2018-12-26 NOTE — Addendum Note (Signed)
Formatting of this note is different from the original.  Addendum  created 12/26/18 1104 by Ledora Bottcher, MD    Order list changed     Electronically signed by Ledora Bottcher, MD at 12/26/2018 11:04 AM EDT

## 2018-12-26 NOTE — Anesthesia Post-Procedure Evaluation (Signed)
Formatting of this note is different from the original.  Patient: Paula Hernandez    Procedure(s):  LEFT BREAST LUMPECTOMY    Procedure Summary     Date: 12/26/18 Room / Location: [facility] OR 01 / [place]    Anesthesia Start: 0853 Anesthesia Stop: 1025    Procedure: LEFT BREAST LUMPECTOMY (Left Breast) Diagnosis:       Left breast mass      Hx of abscess of breast      (Left breast mass [N63.20])      (Hx of abscess of breast [Z87.2])    Surgeons: Theodoro Kalata., MD Responsible Provider: Augusto Garbe, MD    Anesthesia Type: general, regional ASA Status: 3       Anesthesia Type: general, regional  Last vitals  Vitals Value Taken Time   BP 160/140 12/26/18 1031   Temp 97.8 F (36.6 C) 12/26/18 1020   Pulse 105 12/26/18 1041   Resp 21 12/26/18 1036   SpO2 96 % 12/26/18 1041   Vitals shown include unvalidated device data.    Patient location during evaluation: PACU  Patient participation: complete - patient participated  Level of consciousness: awake and alert  Pain score: 3  Pain management: adequate  Airway patency: patent  no PONV   Other Anesthetic complications: no  Cardiovascular status: acceptable  Respiratory status: acceptable  Hydration status: acceptable  Regional Anesthetic used: Yes    Side Effects/complications of Regional Anesthetic: no complications    Comments: Left PECS 1/2 Blocks  NOTE: Pt had laryngospasm on emergence, broken w/positive pressure breaths and a small dose of succinylcholine. Recovery in PACU was uneventful, no signs of respiratory compromise.      Electronically signed by Augusto Garbe, MD at 12/26/2018 10:44 AM EDT

## 2018-12-26 NOTE — Anesthesia Procedure Notes (Signed)
Associated Order(s): Peripheral Nerve Block  Formatting of this note might be different from the original.  Peripheral Nerve Block    Patient location during procedure: OR  Start time: 12/26/2018 9:10 AM  End time: 12/26/2018 9:13 AM  Reason for block: procedure for pain, at surgeon's request and post-op pain management  Procedure requested by:  Baltazar Apo, MD  Staffing  Authorized by: Augusto Garbe, MD   Performed by: Augusto Garbe, MD  Procedure requested by:  Baltazar Apo, MD  Preanesthetic Checklist  Completed: patient identified, IV checked, site marked, risks and benefits discussed, monitors and equipment checked, pre-op evaluation, timeout performed and anesthesia consent given  Peripheral Block  Patient position: supine  Prep: ChloraPrep  Patient monitoring: blood pressure monitoring, cardiac monitor, continuous pulse oximetry and heart rate  Laterality: left  Injection technique: single-shot  Guidance: ultrasound guided  Needle  Needle type: Stimuplex   Needle gauge: 21 G  Needle length: 10 cm  Needle localization: ultrasound guidance  Needle insertion depth: 7 cm    Ultrasound Image:  Image: printed/placed in chart  No apparent anatomical abnormalities noted    Assessment  Injection assessment: negative aspiration for heme, no paresthesia on injection, incremental injection and local visualized surrounding nerve on ultrasound  Paresthesia pain: none  Heart rate change: no  Slow fractionated injection: yes    Additional Notes  PECS1/2 Blocks      Electronically signed by Augusto Garbe, MD at 12/26/2018  9:20 AM EDT

## 2021-10-07 ENCOUNTER — Ambulatory Visit: Payer: MEDICARE | Attending: Surgery | Primary: Primary Care

## 2021-10-07 ENCOUNTER — Ambulatory Visit: Attending: Surgery | Primary: Primary Care

## 2021-10-09 ENCOUNTER — Ambulatory Visit: Admit: 2021-10-09 | Discharge: 2021-10-09 | Payer: MEDICARE | Attending: Surgery | Primary: Primary Care

## 2021-10-09 ENCOUNTER — Encounter

## 2021-10-09 DIAGNOSIS — N649 Disorder of breast, unspecified: Secondary | ICD-10-CM

## 2021-10-09 NOTE — Progress Notes (Unsigned)
[facility]    Subjective    Patient ID: Paula Hernandez is a 46 y.o. female.   Chief Complaint   Patient presents with   . New Patient     (L) Breast Lump     HPI Comments: Paula Hernandez presents today for left breast lump which has been present for several months.  It seems to be getting bigger.  No fluctuation with menstruation.  No overlying skin changes.  There is no nipple discharge, no weight loss.  She had an lesion in the same location back in feb of 2019 which presented as a mass and she underwent excisional biopsy at that time which confirmed inflammatory abscess.  No other issues.  No imaging since then.     Allergies   Allergen Reactions   . Sulfa Antibiotics Anaphylaxis       Current Outpatient Medications:   .  atorvastatin (LIPITOR) 20 MG tablet, TAKE 1 TABLET BY MOUTH ONCE DAILY FOR 90 DAYS, Disp: , Rfl:   .  hydrOXYzine pamoate (VISTARIL) 25 MG capsule, TAKE 1 CAPSULE BY MOUTH THREE TIMES DAILY AS NEEDED FOR ANXIETY AND FOR SLEEP, Disp: , Rfl:   .  vitamin D3 (CHOLECALCIFEROL) 25 MCG (1000 UT) TABS tablet, Take 1 tablet by mouth daily, Disp: , Rfl:   .  lisinopril (PRINIVIL;ZESTRIL) 10 MG tablet, Take 1 tablet by mouth daily, Disp: , Rfl:   .  OLANZapine (ZYPREXA) 10 MG tablet, Take 1 tablet by mouth nightly, Disp: , Rfl:   .  prazosin (MINIPRESS) 2 MG capsule, TAKE 1 CAPSULE BY MOUTH ONCE DAILY AT BEDTIME WITH THE 1 MG DOSE FOR A TOTAL OF 3 MG, Disp: , Rfl:   .  prazosin (MINIPRESS) 1 MG capsule, TAKE 1 CAPSULE BY MOUTH AT BEDTIME AFTER SUPPER, Disp: , Rfl:   .  BD PEN NEEDLE NANO 2ND GEN 32G X 4 MM MISC, USE TO INJECT HUMALOG THREE TIMES A DAY AND LANTUS TWICE A DAY, Disp: , Rfl:   .  Lancets (ONETOUCH DELICA PLUS LANCET33G) MISC, USE 1 TO CHECK GLUCOSE 4 TIMES DAILY, Disp: , Rfl:   .  insulin lispro, 1 Unit Dial, (HUMALOG/ADMELOG) 100 UNIT/ML SOPN, INJECT 2 UNITS SUBCUTANEOUSLY THREE TIMES DAILY WITH MEALS, Disp: , Rfl:   .  LANTUS SOLOSTAR 100 UNIT/ML injection  pen, INJECT 15 UNITS SUBCUTANEOUSLY TWICE DAILY, Disp: , Rfl:   .  ONETOUCH ULTRA strip, USE 1 STRIP TO CHECK GLUCOSE 4 TIMES DAILY, Disp: , Rfl:   Past Medical History:   Diagnosis Date   . Depression    . Diabetes mellitus (HCC)    . Hyperlipidemia    . Hypertension      No past surgical history on file.  Social History     Tobacco Use   . Smoking status: Every Day     Types: Cigarettes   . Smokeless tobacco: Never   Substance Use Topics   . Alcohol use: Never     Family History   Problem Relation Age of Onset   . Diabetes Mother    . Heart Disease Mother    . Brain Cancer Maternal Aunt         Review of Systems   Constitutional:  Negative for appetite change, chills and fever.   Respiratory:  Negative for shortness of breath.    Cardiovascular:  Negative for chest pain and palpitations.   Gastrointestinal:  Negative for abdominal distention, abdominal pain, blood in  stool, constipation, diarrhea, nausea and vomiting.   Hematological:  Does not bruise/bleed easily.   Psychiatric/Behavioral:  Negative for suicidal ideas.        Objective    Vitals:    10/09/21 0947   BP: 131/84   Pulse: (!) 112   Resp: 16   Temp: 98.7 F (37.1 C)   SpO2: 95%      Physical Exam  Constitutional:       Appearance: Normal appearance.   HENT:      Head: Normocephalic and atraumatic.   Cardiovascular:      Rate and Rhythm: Normal rate.   Pulmonary:      Effort: Pulmonary effort is normal. No respiratory distress.   Chest:          Comments: Soft mobile mass subareolar.  Abdominal:      Palpations: Abdomen is soft.   Skin:     General: Skin is warm and dry.   Neurological:      General: No focal deficit present.      Mental Status: She is alert and oriented to person, place, and time.   Psychiatric:         Mood and Affect: Mood normal.         Behavior: Behavior normal.         Thought Content: Thought content normal.         Judgment: Judgment normal.         Problem List Items Addressed This Visit    None       I, Frederich Chick.,  MD personally performed the services described in this document.   This documentation was facilitated by voice recognition software and may contain inadvertent typographical errors.  If there are substantial concerns about the content of this note that may affect patient care, please contact me for clarification.

## 2021-10-09 NOTE — Progress Notes (Unsigned)
Identified patient with two patient identifiers (name and DOB). Reviewed chart in preparation for visit and have obtained necessary documentation.    Paula Hernandez is a 46 y.o. female  Chief Complaint   Patient presents with    New Patient     (L) Breast Lump     BP 131/84 (Site: Left Upper Arm, Position: Sitting, Cuff Size: Small Adult)   Pulse (!) 112   Temp 98.7 F (37.1 C) (Oral)   Resp 16   Ht 5\' 11"  (1.803 m)   Wt 209 lb (94.8 kg)   SpO2 95%   BMI 29.15 kg/m     1. Have you been to the ER, urgent care clinic since your last visit?  Hospitalized since your last visit?no    2. Have you seen or consulted any other health care providers outside of the [facility] since your last visit?  Include any pap smears or colon screening. no

## 2021-10-09 NOTE — Assessment & Plan Note (Addendum)
Sub areolar abscess  Will drain in office.  Will get mammogram and [place] as she is due regardless.  Will follow up afterwords for further palnning

## 2021-10-10 MED ORDER — CEPHALEXIN 500 MG PO CAPS
500 MG | ORAL_CAPSULE | Freq: Four times a day (QID) | ORAL | 0 refills | Status: DC
Start: 2021-10-10 — End: 2022-05-27

## 2021-10-10 NOTE — Telephone Encounter (Signed)
Patient called stating that she just left the pharmacy that we have on file and they do not have any medication there for her from Dr. Elinor Parkinson.

## 2021-10-10 NOTE — Telephone Encounter (Signed)
Patient gave me 2 identifiers name&dob...  Spoke with patient let her know that the medication was sent this morning and to check with her pharmacy to see when it is available for pickup.nw

## 2022-05-20 ENCOUNTER — Ambulatory Visit: Payer: MEDICAID | Attending: Surgical Oncology | Primary: Primary Care

## 2022-05-25 ENCOUNTER — Ambulatory Visit: Admit: 2022-05-25 | Discharge: 2022-05-25 | Payer: MEDICAID | Attending: Surgical Oncology | Primary: Primary Care

## 2022-05-25 DIAGNOSIS — N611 Abscess of the breast and nipple: Secondary | ICD-10-CM

## 2022-05-25 MED ORDER — CEPHALEXIN 500 MG PO CAPS
500 MG | ORAL_CAPSULE | Freq: Four times a day (QID) | ORAL | 0 refills | Status: AC
Start: 2022-05-25 — End: 2022-06-01

## 2022-05-25 NOTE — Progress Notes (Signed)
Identified patient with two patient identifiers (name and DOB). Reviewed chart in preparation for visit and have obtained necessary documentation.    Paula Hernandez is a 47 y.o. female  Chief Complaint   Patient presents with    New Patient     Lump on breast.      BP 139/89 (Site: Left Upper Arm, Position: Sitting, Cuff Size: Small Adult)   Pulse 100   Temp 99.3 F (37.4 C) (Oral)   Resp 17   Ht 1.803 m (5\' 11" )   Wt 100.4 kg (221 lb 6.4 oz)   SpO2 96%   BMI 30.88 kg/m     1. Have you been to the ER, urgent care clinic since your last visit?  Hospitalized since your last visit?no    2. Have you seen or consulted any other health care providers outside of the [facility] since your last visit?  Include any pap smears or colon screening. Yes Therapist

## 2022-05-25 NOTE — Progress Notes (Signed)
Surgery History and Physical    Subjective:      Paula Hernandez  is a 47 y.o.  African American female who presents with recurring subareolar left breast abscesses.  Multiple drainages.  Currently being treated with warm compresses..    Past Medical History:   Diagnosis Date    Depression     Diabetes mellitus ([place])     Hyperlipidemia     Hypertension        Past Surgical History:   Procedure Laterality Date    [place] I&D BREAST ABSCESS DEEP  12/02/2018    [place] I&D BREAST ABSCESS DEEP    [place] I&D BREAST ABSCESS DEEP  06/24/2018    [place] I&D BREAST ABSCESS DEEP    [place] I&D BREAST ABSCESS DEEP  06/15/2017    [place] I&D BREAST ABSCESS DEEP       Social History     Tobacco Use    Smoking status: Every Day     Types: Cigarettes    Smokeless tobacco: Never   Substance Use Topics    Alcohol use: Never       Family History   Problem Relation Age of Onset    Diabetes Mother     Heart Disease Mother     Brain Cancer Maternal Aunt        Current Outpatient Medications on File Prior to Visit   Medication Sig Dispense Refill    atorvastatin (LIPITOR) 20 MG tablet TAKE 1 TABLET BY MOUTH ONCE DAILY FOR 90 DAYS      hydrOXYzine pamoate (VISTARIL) 25 MG capsule TAKE 1 CAPSULE BY MOUTH THREE TIMES DAILY AS NEEDED FOR ANXIETY AND FOR SLEEP      lisinopril (PRINIVIL;ZESTRIL) 10 MG tablet Take 1 tablet by mouth daily      OLANZapine (ZYPREXA) 10 MG tablet Take 1 tablet by mouth nightly      prazosin (MINIPRESS) 2 MG capsule TAKE 1 CAPSULE BY MOUTH ONCE DAILY AT BEDTIME WITH THE 1 MG DOSE FOR A TOTAL OF 3 MG      prazosin (MINIPRESS) 1 MG capsule TAKE 1 CAPSULE BY MOUTH AT BEDTIME AFTER SUPPER      BD PEN NEEDLE NANO 2ND GEN 32G X 4 MM MISC USE TO INJECT HUMALOG THREE TIMES A DAY AND LANTUS TWICE A DAY      Lancets (ONETOUCH DELICA PLUS CHENID78E) MISC USE 1 TO CHECK GLUCOSE 4 TIMES DAILY      insulin lispro, 1 Unit Dial, (HUMALOG/ADMELOG) 100 UNIT/ML SOPN INJECT 2 UNITS SUBCUTANEOUSLY THREE TIMES DAILY WITH MEALS      LANTUS SOLOSTAR 100 UNIT/ML injection  pen INJECT 15 UNITS SUBCUTANEOUSLY TWICE DAILY      ONETOUCH ULTRA strip USE 1 STRIP TO CHECK GLUCOSE 4 TIMES DAILY      cephALEXin (KEFLEX) 500 MG capsule Take 1 capsule by mouth 4 times daily 28 capsule 0    vitamin D3 (CHOLECALCIFEROL) 25 MCG (1000 UT) TABS tablet Take 1 tablet by mouth daily (Patient not taking: Reported on 05/25/2022)       No current facility-administered medications on file prior to visit.       Allergies   Allergen Reactions    Sulfa Antibiotics Anaphylaxis         Review of Systems:    Pertinent items are noted in the History of Present Illness.    Objective:     Vitals:    05/25/22 1400   BP: 139/89   Pulse:    Resp:  Temp:    SpO2:         Physical Exam:  GENERAL: alert, appears stated age, and cooperative, LYMPHATIC: Cervical, supraclavicular, and axillary nodes normal., LUNG: clear to auscultation bilaterally, HEART: regular rate and rhythm, S1, S2 normal, no murmur, click, rub or gallop, ABDOMEN: soft, non-tender; bowel sounds normal; no masses,  no organomegaly, SKIN: left breast subareolar inflammation with no fluctuance. Involves at least half the circumference    Labs: No results found for this or any previous visit (from the past 24 hour(s)).    Data Review:   none    Assessment and Plan:      Diagnosis Orders   1. Breast abscess  cephALEXin (KEFLEX) 500 MG capsule          Recurring left breast subareolar abscesses.  I told her the recommendation would be to remove all the ducts under the left nipple to try to remove the nidus of infection.. Her comment was "let's do it now". Will schedule at her conveneience      Signed By: Marko Stai, MD     05/25/22

## 2022-05-25 NOTE — H&P (View-Only) (Signed)
Surgery History and Physical    Subjective:      Paula Hernandez  is a 47 y.o.  African American female who presents with recurring subareolar left breast abscesses.  Multiple drainages.  Currently being treated with warm compresses..    Past Medical History:   Diagnosis Date    Depression     Diabetes mellitus (HCC)     Hyperlipidemia     Hypertension        Past Surgical History:   Procedure Laterality Date    US I&D BREAST ABSCESS DEEP  12/02/2018    US I&D BREAST ABSCESS DEEP    US I&D BREAST ABSCESS DEEP  06/24/2018    US I&D BREAST ABSCESS DEEP    US I&D BREAST ABSCESS DEEP  06/15/2017    US I&D BREAST ABSCESS DEEP       Social History     Tobacco Use    Smoking status: Every Day     Types: Cigarettes    Smokeless tobacco: Never   Substance Use Topics    Alcohol use: Never       Family History   Problem Relation Age of Onset    Diabetes Mother     Heart Disease Mother     Brain Cancer Maternal Aunt        Current Outpatient Medications on File Prior to Visit   Medication Sig Dispense Refill    atorvastatin (LIPITOR) 20 MG tablet TAKE 1 TABLET BY MOUTH ONCE DAILY FOR 90 DAYS      hydrOXYzine pamoate (VISTARIL) 25 MG capsule TAKE 1 CAPSULE BY MOUTH THREE TIMES DAILY AS NEEDED FOR ANXIETY AND FOR SLEEP      lisinopril (PRINIVIL;ZESTRIL) 10 MG tablet Take 1 tablet by mouth daily      OLANZapine (ZYPREXA) 10 MG tablet Take 1 tablet by mouth nightly      prazosin (MINIPRESS) 2 MG capsule TAKE 1 CAPSULE BY MOUTH ONCE DAILY AT BEDTIME WITH THE 1 MG DOSE FOR A TOTAL OF 3 MG      prazosin (MINIPRESS) 1 MG capsule TAKE 1 CAPSULE BY MOUTH AT BEDTIME AFTER SUPPER      BD PEN NEEDLE NANO 2ND GEN 32G X 4 MM MISC USE TO INJECT HUMALOG THREE TIMES A DAY AND LANTUS TWICE A DAY      Lancets (ONETOUCH DELICA PLUS LANCET33G) MISC USE 1 TO CHECK GLUCOSE 4 TIMES DAILY      insulin lispro, 1 Unit Dial, (HUMALOG/ADMELOG) 100 UNIT/ML SOPN INJECT 2 UNITS SUBCUTANEOUSLY THREE TIMES DAILY WITH MEALS      LANTUS SOLOSTAR 100 UNIT/ML injection  pen INJECT 15 UNITS SUBCUTANEOUSLY TWICE DAILY      ONETOUCH ULTRA strip USE 1 STRIP TO CHECK GLUCOSE 4 TIMES DAILY      cephALEXin (KEFLEX) 500 MG capsule Take 1 capsule by mouth 4 times daily 28 capsule 0    vitamin D3 (CHOLECALCIFEROL) 25 MCG (1000 UT) TABS tablet Take 1 tablet by mouth daily (Patient not taking: Reported on 05/25/2022)       No current facility-administered medications on file prior to visit.       Allergies   Allergen Reactions    Sulfa Antibiotics Anaphylaxis         Review of Systems:    Pertinent items are noted in the History of Present Illness.    Objective:     Vitals:    05/25/22 1400   BP: 139/89   Pulse:    Resp:      Temp:    SpO2:         Physical Exam:  GENERAL: alert, appears stated age, and cooperative, LYMPHATIC: Cervical, supraclavicular, and axillary nodes normal., LUNG: clear to auscultation bilaterally, HEART: regular rate and rhythm, S1, S2 normal, no murmur, click, rub or gallop, ABDOMEN: soft, non-tender; bowel sounds normal; no masses,  no organomegaly, SKIN: left breast subareolar inflammation with no fluctuance. Involves at least half the circumference    Labs: No results found for this or any previous visit (from the past 24 hour(s)).    Data Review:   none    Assessment and Plan:      Diagnosis Orders   1. Breast abscess  cephALEXin (KEFLEX) 500 MG capsule          Recurring left breast subareolar abscesses.  I told her the recommendation would be to remove all the ducts under the left nipple to try to remove the nidus of infection.. Her comment was "let's do it now". Will schedule at her conveneience      Signed By: Marko Stai, MD     05/25/22

## 2022-05-26 ENCOUNTER — Encounter

## 2022-05-26 NOTE — Telephone Encounter (Signed)
Talked to patient regarding scheduling surgery with Dr. Jerline Pain. Offered the date of 01/29 and patient accepted.

## 2022-05-26 NOTE — Other (Signed)
PAT APPT 06/03/22 @ 8:00

## 2022-06-03 ENCOUNTER — Inpatient Hospital Stay: Admit: 2022-06-03 | Discharge: 2022-06-10 | Payer: MEDICAID | Primary: Primary Care

## 2022-06-03 DIAGNOSIS — Z01818 Encounter for other preprocedural examination: Secondary | ICD-10-CM

## 2022-06-03 LAB — BASIC METABOLIC PANEL
Anion Gap: 4 mmol/L — ABNORMAL LOW (ref 5–15)
BUN/Creatinine Ratio: 7 — ABNORMAL LOW (ref 12–20)
BUN: 6 MG/DL (ref 6–20)
CO2: 25 mmol/L (ref 21–32)
Calcium: 9.6 MG/DL (ref 8.5–10.1)
Chloride: 109 mmol/L — ABNORMAL HIGH (ref 97–108)
Creatinine: 0.83 MG/DL (ref 0.55–1.02)
Est, Glom Filt Rate: >60 mL/min/{1.73_m2} (ref >60)
Glucose: 123 mg/dL — ABNORMAL HIGH (ref 65–100)
Potassium: 4.1 mmol/L (ref 3.5–5.1)
Sodium: 138 mmol/L (ref 136–145)

## 2022-06-03 LAB — CBC WITH AUTO DIFFERENTIAL
Basophils %: 1 % (ref 0–1)
Basophils Absolute: 0 10*3/uL (ref 0.0–0.1)
Eosinophils %: 2 % (ref 0–7)
Eosinophils Absolute: 0.1 10*3/uL (ref 0.0–0.4)
Hematocrit: 42.5 % (ref 35.0–47.0)
Hemoglobin: 14.5 g/dL (ref 11.5–16.0)
Immature Granulocytes %: 1 % — ABNORMAL HIGH (ref 0.0–0.5)
Immature Granulocytes Absolute: 0 10*3/uL (ref 0.00–0.04)
Lymphocytes %: 38 % (ref 12–49)
Lymphocytes Absolute: 2.3 10*3/uL (ref 0.8–3.5)
MCH: 30.1 PG (ref 26.0–34.0)
MCHC: 34.1 g/dL (ref 30.0–36.5)
MCV: 88.2 FL (ref 80.0–99.0)
MPV: 8.8 FL — ABNORMAL LOW (ref 8.9–12.9)
Monocytes %: 9 % (ref 5–13)
Monocytes Absolute: 0.5 10*3/uL (ref 0.0–1.0)
Neutrophils %: 49 % (ref 32–75)
Neutrophils Absolute: 3.1 10*3/uL (ref 1.8–8.0)
Nucleated RBCs: 0 PER 100 WBC
Platelets: 470 10*3/uL — ABNORMAL HIGH (ref 150–400)
RBC: 4.82 M/uL (ref 3.80–5.20)
RDW: 13.8 % (ref 11.5–14.5)
WBC: 6.1 10*3/uL (ref 3.6–11.0)
nRBC: 0 10*3/uL (ref 0.00–0.01)

## 2022-06-03 LAB — EKG 12-LEAD
Atrial Rate: 74 {beats}/min
Diagnosis: NORMAL
P Axis: 54 degrees
P-R Interval: 146 ms
Q-T Interval: 372 ms
QRS Duration: 86 ms
QTc Calculation (Bazett): 412 ms
R Axis: 30 degrees
T Axis: 33 degrees
Ventricular Rate: 74 {beats}/min

## 2022-06-03 LAB — BILIRUBIN, CONFIRMATORY: Bilirubin Confirmation, UA: NEGATIVE

## 2022-06-03 NOTE — Other (Signed)
ABNORMAL LABS SENT VIA MSG TO SURGEON'S NURSE    ABNORMAL LABS FROM PAT ON 06/03/2022 DAY OF SURGERY:     BMP:  CHLORIDE H 106  GLUCOSE  H 123    HGBA1C: H 8.5    ANY QUESTIONS YOU CAN REACH ME HERE WITH RESPONSE OR CALL ME AT [phone]  THANKS HAVE A GREAT DAY KIM     LABS ROUTED TO PCP VIA CC

## 2022-06-03 NOTE — Other (Signed)
TRANSPIRATIONS PROVIDED TO AND FROM HOSPITAL BY MEDICAID, PT STATES THAT SHE DOES NOT HAVE ANYONE TO BRING HER/ TAKE HER HOME FROM SURGERY BUT SHE DOES HAVE FAMILY MEMBERS AT HOME TO CARE FOR HER AFTER SURGERY.     PT SMOKES MARIJUANA 4X DAILY. PT INSTRUCTED TO STOP 3 DAYS PRIOR TO SURGERY. PT VERBALIZED UNDERSTANDING.     PT HAS A HISTORY OF PTSD BUT STATES THAT SHE IS NOT VIOLENT, AND DOES NOT HAVE ANY TRIGGERS THAT WE NEED TO AVOID DURING [place].

## 2022-06-03 NOTE — Other (Signed)
[place] INSTRUCTIONS    Surgery Date:   06/08/2022    Your surgeon's office or [facility] staff will call you between 4 PM- 8 PM the day before surgery with your arrival time. If your surgery is on a Monday, you will receive a call the preceding Friday. If your surgeon's office has given you, your arrival time then go by that time.    Please report to [facility] Patient Access/Admitting on the 1st floor.  Bring your insurance card, photo identification, and any copayment ( if applicable).   If you are going home the same day of your surgery, you must have a responsible adult to drive you home. You need to have a responsible adult to stay with you the first 24 hours after surgery and you should not drive a car for 24 hours following your surgery.  If you are being admitted to the hospital, please leave personal belongings/luggage in your car until you have an assigned hospital room number.  Do NOT drink alcohol or smoke 24 hours before surgery. STOP smoking for 14 days prior as it helps with breathing and healing after surgery.  Please wear comfortable clothes. Wear your glasses instead of contacts. We ask that all money, jewelry and valuables be left at home. Wear no make up, particularly mascara, the day of surgery.    All body piercings, rings, and jewelry need to be removed and left at home. Remove all nail polish except for clear. Please wear your hair loose or down, no pony-tails, buns, or any metal hair accessories. You may wear deodorant, unless having breast surgery.  Do not shave any body area within 24 hours of your surgery.  Please follow all instructions to avoid any potential surgical cancellation.  Should your physical condition change, (i.e. fever, cold, flu, etc.) please notify your surgeon as soon as possible.  It is important to be on time. If a situation occurs where you may be delayed, please call:  [phone] / [phone] on the day of surgery.  The Preadmission  Testing staff can be reached at [phone].  Special instructions: PLEASE FOLLOW ANY ADDITIONAL INSTRUCTIONS GIVEN TO YOU BY YOUR SURGEON.       Eating and Drinking Before Surgery    You may eat a regular dinner at the usual time on the day before your surgery.  Do NOT eat any solid foods after midnight.  You may drink clear liquids only from 12 midnight until 1 hours prior to your arrival time at the hospital on the day of your surgery. Do NOT drink alcohol.  Clear liquids include:  Water  Fruit juices without pulp( i.e. apple juice)  Carbonated beverages  Black coffee (no cream/milk)  Tea (no cream/milk)  Gatorade  You may drink up to 12-16 ounces at one time every 4 hours between the hours of midnight and 1 hour before your arrival time at the hospital. Example- if your arrival time at the hospital is 6am, you may drink 12-16 ounces of clear liquids no later than 5am.  If you have any questions, please contact your surgeon's office.    Current Outpatient Medications   Medication Sig    FLUoxetine (PROZAC) 20 MG capsule Take 1 capsule by mouth daily    buPROPion (WELLBUTRIN SR) 150 MG extended release tablet Take 1 tablet by mouth daily    atorvastatin (LIPITOR) 20 MG tablet TAKE 1 TABLET BY MOUTH ONCE DAILY FOR 90 DAYS  hydrOXYzine pamoate (VISTARIL) 25 MG capsule TAKE 1 CAPSULE BY MOUTH THREE TIMES DAILY AS NEEDED FOR ANXIETY AND FOR SLEEP    lisinopril (PRINIVIL;ZESTRIL) 10 MG tablet Take 1 tablet by mouth daily    OLANZapine (ZYPREXA) 10 MG tablet Take 1 tablet by mouth nightly    prazosin (MINIPRESS) 2 MG capsule Take 1 capsule by mouth daily    prazosin (MINIPRESS) 1 MG capsule TAKE 1 CAPSULE BY MOUTH AT BEDTIME AFTER SUPPER    BD PEN NEEDLE NANO 2ND GEN 32G X 4 MM MISC USE TO INJECT HUMALOG THREE TIMES A DAY AND LANTUS TWICE A DAY    Lancets (ONETOUCH DELICA PLUS [id number]) MISC USE 1 TO CHECK GLUCOSE 4 TIMES DAILY    insulin lispro, 1 Unit Dial, (HUMALOG/ADMELOG) 100 UNIT/ML SOPN INJECT 2 UNITS  SUBCUTANEOUSLY THREE TIMES DAILY WITH MEALS    LANTUS SOLOSTAR 100 UNIT/ML injection pen Inject 15 Units into the skin daily    ONETOUCH ULTRA strip USE 1 STRIP TO CHECK GLUCOSE 4 TIMES DAILY     No current facility-administered medications for this encounter.        YOU MUST ONLY TAKE THESE MEDICATIONS THE MORNING OF SURGERY  ATORVASTATIN, PRAZOSIN, PROZAC, WELLBUTRIN  MEDICATIONS TO TAKE THE MORNING OF SURGERY ONLY IF NEEDED: HYDROXYZINE  HOLD these prescription medications BEFORE Surgery: HUMALOG, LISINOPRIL  Ask your surgeon/prescribing physician about when/if to STOP taking these medications: LANTUS.  (If you are currently taking Plavix, Coumadin,or any other blood-thinning/anticoagulant medication contact your prescribing physician for instructions).  Stop all vitamins, herbal medicines and Aspirin containing products 7 days prior to surgery. Stop any non-steroidal anti-inflammatory drugs (i.e. Ibuprofen, Naproxen, Advil, Aleve) 3 days before surgery. You may take Tylenol.              Preventing Infections Before and After - Your Surgery    IMPORTANT INSTRUCTIONS    You play an important role in your health and preparation for surgery. To reduce the germs on your skin you will need to shower with CHG soap (Chorhexidine gluconate 4%) two times before surgery.    CHG soap (Hibiclens, Hex-A-Clens or store brand)  CHG soap will be provided at your Preadmission Testing (PAT) appointment.  If you do not have a PAT appointment before surgery, you may arrange to pick up CHG soap from our office or purchase CHG soap at a pharmacy, grocery or department store.  You need to purchase TWO 4 ounce bottles to use for your 2 showers.    Steps to follow:  Wash your hair with your normal shampoo and your body with regular soap and rinse well to remove shampoo and soap from your skin.  Wet a clean washcloth and turn off the shower.  Put CHG soap on washcloth and apply to your entire body from the neck down. Do not use on your  head, face or private parts(genitals). Do not use CHG soap on open sores, wounds or areas of skin irritation.  Wash you body gently for 5 minutes. Do not wash your skin too hard. This soap does not create lather. Pay special attention to your underarms and from your belly button to your feet.  Turn the shower back on and rinse well to get CHG soap off your body.  Pat your skin dry with a clean, dry towel. Do not apply lotions or moisturizer.  Put on clean clothes and sleep on fresh bed sheets and do not allow pets to sleep with you.  Shower with CHG soap 2 times before your surgery  The evening before your surgery  The morning of your surgery      Tips to help prevent infections after your surgery:  Protect your surgical wound from germs:  Hand washing is the most important thing you and your caregivers can do to prevent infections.  Keep your bandage clean and dry!  Do not touch your surgical wound.  Use clean, freshly washed towels and washcloths every time you shower; do not share bath linens with others.  Until your surgical wound is healed, wear clothing and sleep on bed linens each day that are clean and freshly washed.  Do not allow pets to sleep in your bed with you or touch your surgical wound.  Do not smoke - smoking delays wound healing. This may be a good time to stop smoking.  If you have diabetes, it is important for you to manage your blood sugar levels properly before your surgery as well as after your surgery. Poorly managed blood sugar levels slow down wound healing and prevent you from healing completely.        Day of Procedure    Please park in the parking deck or any designated visitor parking lot.  Enter the facility through the Main Entrance of the hospital.  On the day of surgery, please provide the cell phone number of the person who will be waiting for you to the Patient Access representative at the time of registration.  Masks are highly recommended in the hospital, but not  required.  Once your procedure and the immediate recovery period is completed, a nurse in the recovery area will contact your designated visitor to inform them of your room number or to otherwise review other pertinent information regarding your care.    Social distancing practices are strongly encouraged in waiting areas and the cafeteria.       The patient was contacted in person.   She verbalized understanding of all instructions does not need reinforcement.

## 2022-06-04 LAB — URINALYSIS WITH REFLEX TO CULTURE
Glucose, UA: NEGATIVE mg/dL
Nitrite, Urine: POSITIVE — AB
Protein, UA: 100 mg/dL — AB
RBC, UA: >100 /hpf — ABNORMAL HIGH (ref 0–5)
Specific Gravity, UA: 1.017 (ref 1.003–1.030)
Urobilinogen, Urine: 0.2 EU/dL (ref 0.2–1.0)
pH, Urine: 5 (ref 5.0–8.0)

## 2022-06-04 LAB — HEMOGLOBIN A1C
Estimated Avg Glucose: 197 mg/dL
Hemoglobin A1C: 8.5 % — ABNORMAL HIGH (ref 4.0–5.6)

## 2022-06-05 NOTE — Telephone Encounter (Signed)
Called patient to let her know her surgery time has changed to 7:30am and she needs to be there at 5:30am. Patient understood.

## 2022-06-08 ENCOUNTER — Inpatient Hospital Stay: Payer: Medicaid (Managed Care) | Attending: Surgical Oncology

## 2022-06-08 LAB — POCT GLUCOSE
POC Glucose: 195 mg/dL — ABNORMAL HIGH (ref 65–117)
POC Glucose: 187 mg/dL — ABNORMAL HIGH (ref 65–117)

## 2022-06-08 LAB — POC PREGNANCY UR-QUAL: Preg Test, Ur: NEGATIVE

## 2022-06-08 MED ORDER — FENTANYL CITRATE (PF) 100 MCG/2ML IJ SOLN
100 MCG/2ML | Freq: Once | INTRAMUSCULAR | Status: DC | PRN
Start: 2022-06-08 — End: 2022-06-08

## 2022-06-08 MED ORDER — ACETAMINOPHEN 500 MG PO TABS
500 MG | Freq: Once | ORAL | Status: DC
Start: 2022-06-08 — End: 2022-06-08

## 2022-06-08 MED ORDER — FENTANYL CITRATE (PF) 100 MCG/2ML IJ SOLN
100 MCG/2ML | INTRAMUSCULAR | Status: AC | PRN
Start: 2022-06-08 — End: 2022-06-08
  Administered 2022-06-08 (×4): 25 ug via INTRAVENOUS

## 2022-06-08 MED ORDER — PROCHLORPERAZINE EDISYLATE 10 MG/2ML IJ SOLN
10 MG/2ML | Freq: Once | INTRAMUSCULAR | Status: DC | PRN
Start: 2022-06-08 — End: 2022-06-08

## 2022-06-08 MED ORDER — OXYCODONE-ACETAMINOPHEN 5-325 MG PO TABS
5-325 MG | ORAL_TABLET | Freq: Four times a day (QID) | ORAL | 0 refills | Status: AC | PRN
Start: 2022-06-08 — End: 2022-06-13

## 2022-06-08 MED ORDER — HYDRALAZINE HCL 20 MG/ML IJ SOLN
20 MG/ML | INTRAMUSCULAR | Status: DC | PRN
Start: 2022-06-08 — End: 2022-06-08

## 2022-06-08 MED ORDER — NORMAL SALINE FLUSH 0.9 % IV SOLN
0.9 % | INTRAVENOUS | Status: DC | PRN
Start: 2022-06-08 — End: 2022-06-08

## 2022-06-08 MED ORDER — BUPIVACAINE-EPINEPHRINE 0.5% -1:200000 IJ SOLN
INTRAMUSCULAR | Status: DC | PRN
Start: 2022-06-08 — End: 2022-06-08
  Administered 2022-06-08: 13:00:00 3 via INTRAMUSCULAR

## 2022-06-08 MED ORDER — MIDAZOLAM HCL (PF) 2 MG/2ML IJ SOLN
2 MG/ML | Freq: Once | INTRAMUSCULAR | Status: DC | PRN
Start: 2022-06-08 — End: 2022-06-08

## 2022-06-08 MED ORDER — FENTANYL CITRATE (PF) 100 MCG/2ML IJ SOLN
100 MCG/2ML | INTRAMUSCULAR | Status: AC
Start: 2022-06-08 — End: ?

## 2022-06-08 MED ORDER — FENTANYL CITRATE (PF) 100 MCG/2ML IJ SOLN
100 MCG/2ML | INTRAMUSCULAR | Status: DC | PRN
Start: 2022-06-08 — End: 2022-06-08
  Administered 2022-06-08 (×3): 50 via INTRAVENOUS
  Administered 2022-06-08 (×2): 25 via INTRAVENOUS

## 2022-06-08 MED ORDER — PROPOFOL 200 MG/20ML IV EMUL
200 MG/20ML | INTRAVENOUS | Status: AC
Start: 2022-06-08 — End: ?

## 2022-06-08 MED ORDER — LIDOCAINE HCL (PF) 2 % IJ SOLN
2 % | INTRAMUSCULAR | Status: AC
Start: 2022-06-08 — End: ?

## 2022-06-08 MED ORDER — LACTATED RINGERS IV SOLN
INTRAVENOUS | Status: DC
Start: 2022-06-08 — End: 2022-06-08
  Administered 2022-06-08: 12:00:00 via INTRAVENOUS

## 2022-06-08 MED ORDER — ACETAMINOPHEN 500 MG PO TABS
500 MG | Freq: Once | ORAL | Status: AC
Start: 2022-06-08 — End: 2022-06-08
  Administered 2022-06-08: 12:00:00 1000 mg via ORAL

## 2022-06-08 MED ORDER — MIDAZOLAM HCL (PF) 2 MG/2ML IJ SOLN
2 MG/ML | INTRAMUSCULAR | Status: AC | PRN
Start: 2022-06-08 — End: 2022-06-08
  Administered 2022-06-08: 15:00:00 1 mg via INTRAVENOUS

## 2022-06-08 MED ORDER — DEXAMETHASONE 4 MG/ML IJ SOLN (MIXTURES ONLY)
4 MG/ML | INTRAMUSCULAR | Status: DC | PRN
Start: 2022-06-08 — End: 2022-06-08
  Administered 2022-06-08: 13:00:00 8 via INTRAVENOUS

## 2022-06-08 MED ORDER — CEFAZOLIN SODIUM 1 G IJ SOLR
1 g | INTRAMUSCULAR | Status: AC
Start: 2022-06-08 — End: 2022-06-08
  Administered 2022-06-08: 13:00:00 2000 mg via INTRAVENOUS

## 2022-06-08 MED ORDER — NORMAL SALINE FLUSH 0.9 % IV SOLN
0.9 % | Freq: Two times a day (BID) | INTRAVENOUS | Status: DC
Start: 2022-06-08 — End: 2022-06-08

## 2022-06-08 MED ORDER — LIDOCAINE HCL (PF) 2 % IJ SOLN
2 % | INTRAMUSCULAR | Status: DC | PRN
Start: 2022-06-08 — End: 2022-06-08
  Administered 2022-06-08: 13:00:00 100 via INTRAVENOUS

## 2022-06-08 MED ORDER — MIDAZOLAM HCL 5 MG/5ML IJ SOLN
5 MG/ML | INTRAMUSCULAR | Status: DC | PRN
Start: 2022-06-08 — End: 2022-06-08
  Administered 2022-06-08: 12:00:00 3 via INTRAVENOUS
  Administered 2022-06-08: 13:00:00 2 via INTRAVENOUS

## 2022-06-08 MED ORDER — SODIUM CHLORIDE 0.9 % IV SOLN
0.9 % | INTRAVENOUS | Status: DC | PRN
Start: 2022-06-08 — End: 2022-06-08

## 2022-06-08 MED ORDER — ONDANSETRON HCL 4 MG/2ML IJ SOLN
4 MG/2ML | INTRAMUSCULAR | Status: DC | PRN
Start: 2022-06-08 — End: 2022-06-08
  Administered 2022-06-08: 14:00:00 4 via INTRAVENOUS

## 2022-06-08 MED ORDER — MIDAZOLAM HCL 2 MG/2ML IJ SOLN
2 MG/ML | INTRAMUSCULAR | Status: AC
Start: 2022-06-08 — End: 2022-06-08
  Administered 2022-06-08: 14:00:00 1 via INTRAVENOUS

## 2022-06-08 MED ORDER — MIDAZOLAM HCL 5 MG/5ML IJ SOLN
5 MG/ML | INTRAMUSCULAR | Status: AC
Start: 2022-06-08 — End: ?

## 2022-06-08 MED ORDER — BUPIVACAINE HCL (PF) 0.5 % IJ SOLN
0.5 % | INTRAMUSCULAR | Status: AC
Start: 2022-06-08 — End: ?

## 2022-06-08 MED ORDER — LIDOCAINE HCL (PF) 1 % IJ SOLN
1 % | Freq: Once | INTRAMUSCULAR | Status: DC | PRN
Start: 2022-06-08 — End: 2022-06-08

## 2022-06-08 MED ORDER — OXYCODONE HCL 5 MG PO TABS
5 MG | Freq: Once | ORAL | Status: DC | PRN
Start: 2022-06-08 — End: 2022-06-08

## 2022-06-08 MED ORDER — PROPOFOL 200 MG/20ML IV EMUL
200 MG/20ML | INTRAVENOUS | Status: DC | PRN
Start: 2022-06-08 — End: 2022-06-08
  Administered 2022-06-08: 13:00:00 200 via INTRAVENOUS

## 2022-06-08 MED ORDER — ONDANSETRON HCL 4 MG/2ML IJ SOLN
4 MG/2ML | Freq: Once | INTRAMUSCULAR | Status: DC | PRN
Start: 2022-06-08 — End: 2022-06-08

## 2022-06-08 MED ORDER — HYDROMORPHONE HCL PF 1 MG/ML IJ SOLN
1 MG/ML | INTRAMUSCULAR | Status: AC | PRN
Start: 2022-06-08 — End: 2022-06-08
  Administered 2022-06-08 (×2): 0.5 mg via INTRAVENOUS

## 2022-06-08 MED ORDER — EPINEPHRINE PF 1 MG/ML IJ SOLN
1 MG/ML | INTRAMUSCULAR | Status: AC
Start: 2022-06-08 — End: ?

## 2022-06-08 MED FILL — MIDAZOLAM HCL 2 MG/2ML IJ SOLN: 2 MG/ML | INTRAMUSCULAR | Qty: 2

## 2022-06-08 MED FILL — FENTANYL CITRATE (PF) 100 MCG/2ML IJ SOLN: 100 MCG/2ML | INTRAMUSCULAR | Qty: 2

## 2022-06-08 MED FILL — HYDROMORPHONE HCL 1 MG/ML IJ SOLN: 1 MG/ML | INTRAMUSCULAR | Qty: 1

## 2022-06-08 MED FILL — NORMAL SALINE FLUSH 0.9 % IV SOLN: 0.9 % | INTRAVENOUS | Qty: 40

## 2022-06-08 MED FILL — LIDOCAINE HCL (PF) 2 % IJ SOLN: 2 % | INTRAMUSCULAR | Qty: 5

## 2022-06-08 MED FILL — LACTATED RINGERS IV SOLN: INTRAVENOUS | Qty: 1000

## 2022-06-08 MED FILL — CEFAZOLIN SODIUM 1 G IJ SOLR: 1 g | INTRAMUSCULAR | Qty: 2000

## 2022-06-08 MED FILL — MIDAZOLAM HCL 5 MG/5ML IJ SOLN: 5 MG/ML | INTRAMUSCULAR | Qty: 5

## 2022-06-08 MED FILL — SENSORCAINE-MPF 0.5 % IJ SOLN: 0.5 % | INTRAMUSCULAR | Qty: 30

## 2022-06-08 MED FILL — ACETAMINOPHEN EXTRA STRENGTH 500 MG PO TABS: 500 MG | ORAL | Qty: 2

## 2022-06-08 MED FILL — DIPRIVAN 200 MG/20ML IV EMUL: 200 MG/20ML | INTRAVENOUS | Qty: 20

## 2022-06-08 MED FILL — EPINEPHRINE PF 1 MG/ML IJ SOLN: 1 MG/ML | INTRAMUSCULAR | Qty: 1

## 2022-06-08 NOTE — Other (Signed)
Pt very anxious and crying and shaking. States having 10/10 pain despite numerous medication of protocol medication. Pt did not take her psychotropics today or last night. Last had them 24 hrs ago. Only had vistaril today. Also given versed for anxiety.

## 2022-06-08 NOTE — Op Note (Signed)
[place]  OPERATIVE REPORT    Name:  Paula Hernandez, Paula Hernandez  MR#:  [id number]  DOB:  10-19-1975  ACCOUNT #:  [id number]  DATE OF SERVICE:  06/08/2022    PREOPERATIVE DIAGNOSIS:  Recurring left breast abscesses.    POSTOPERATIVE DIAGNOSIS:  Recurring left breast abscesses.    PROCEDURE PERFORMED:  Left breast ductal excision.    SURGEON:  Marko Stai, MD    ASSISTANT:  Rondell Reams.    ANESTHESIA:  General via LMA plus 0.5% Marcaine with epinephrine.    COMPLICATIONS:  None.    SPECIMENS REMOVED:  1.  The tissue of the left breast ductal excision.  2.  A culture of one of the small micro abscesses.    IMPLANTS:  None.    DRAINS:  One quarter-inch Penrose drain.    ESTIMATED BLOOD LOSS:  Minimal.    FINDINGS:  Findings were that of multiloculated ductal abscesses immediately beneath the nipple, each 1 or 2 mm in size.    PROCEDURE:  With the patient supine and suitably anesthetized, the left breast was prepared with ChloraPrep and draped as a field.  0.5% Marcaine with epinephrine was infiltrated in the skin of the areola cutaneous junction and a curvilinear incision from the 8 o'clock to 4 o'clock was made.  The skin of the areola was then elevated and separated from the underlying breast tissue.  At the nipple area, there was a firm area which upon dissection revealed multiloculated abscesses, which were cultured.  The entire nipple-areolar complex was elevated and then the ductal system was grasped and excised using the cautery.  I then debrided further the nipple area, getting it down to just the cutaneous region.  This afforded a good complete excision.  The skin was then re-approximated with interrupted 3-0 nylon sutures.  On the lateral aspect, a quarter-inch Penrose drain was placed into the cavity and brought out through the incision and secured with a nylon suture.  Dry dressing was applied.  At the termination of the procedure, all counts were correct.  The patient tolerated this well and  was brought to the PACU in satisfactory condition.      Marko Stai, MD      [id number]  D:  06/08/2022 8:57  T:  06/08/2022 11:43  JOB #:  [id number]  CC:  Tama Headings, NP

## 2022-06-08 NOTE — Progress Notes (Signed)
Discharge instructions reviewed with patient and family using tesachback. All questions have been answered. Prescriptions sent to [facility]. Vital signs stable, pain appropriately managed. Patient wheeled off the unit with PACU staff.

## 2022-06-08 NOTE — Discharge Instructions (Addendum)
After anesthesia your first meal should be light. Avoid foods that are greasy or spicy. Progress to your regular diet as tolerated.    Anesthesia Discharge Instructions    After general anesthesia or intervenous sedation, for 24 hours or while taking prescription Narcotics:  Limit your activities  Do not drive or operate hazardous machinery  If you have not urinated within 8 hours after discharge, please contact your surgeon on call.  Do not make important personal or business decisions  Do not drink alcoholic beverages    Report the following to your surgeon:  Excessive pain, swelling, redness or odor of or around the surgical area  Temperature over 100.5 degrees  Nausea and vomiting lasting longer than 4 hours or if unable to take medication  Any signs of decreased circulation or nerve impairment to extremity:  Change in color, persistent numbness, tingling, coldness or increased pain.  Any questions    Wound Care    Keep dressing dry.   Change dressing daily.   May wash area gently with soap and water.   Pat dry.  Replace with dry dressing.  See Surgeon in one week.

## 2022-06-08 NOTE — Brief Op Note (Addendum)
Brief Postoperative Note      Patient: Paula Hernandez  Date of Birth: 01/26/1976  MRN: [id number]    Date of Procedure: 06-27-2022    Pre-Op Diagnosis Codes:     * Abscess of breast [N61.1]    Post-Op Diagnosis: Same       Procedure(s):  LEFT BREAST DUCTAL EXCISION    Surgeon(s):  Marko Stai, MD    Assistant:  Surgical Assistant: Rondell Reams    Anesthesia: General    Estimated Blood Loss (mL): Minimal    Complications: None    Specimens:   ID Type Source Tests Collected by Time Destination   1 : LEFT BREAST DUCTAL EXCISION Tissue Breast SURGICAL PATHOLOGY Marko Stai, MD 27-Jun-2022 [phone]    A : LEFT BREAST ABCESS (AEROBIC AND ANAEROBIC Tissue Tissue CULTURE, TISSUE Marko Stai, MD 06/27/22 0813        Implants:  * No implants in log *      Drains: 1/4 inch Penrose    Findings: multiloculated ductal abscesses immediately below the nipple, each 1 to 2 mm in size      Electronically signed by Gerre Scull, MD on 06/27/22 at 8:54 AM    [phone]

## 2022-06-08 NOTE — Anesthesia Pre-Procedure Evaluation (Signed)
Department of Anesthesiology  Preprocedure Note       Name:  Paula Hernandez   Age:  47 y.o.  DOB:  19-Apr-1976                                          MRN:  [id number]         Date:  06/08/2022      Surgeon: Juliann Mule):  Jerline Pain Jenne Pane, MD    Procedure: Procedure(s):  LEFT BREAST DUCTAL EXCISION    Medications prior to admission:   Prior to Admission medications    Medication Sig Start Date End Date Taking? Authorizing Provider   FLUoxetine (PROZAC) 20 MG capsule Take 1 capsule by mouth daily    [provider]   buPROPion (WELLBUTRIN SR) 150 MG extended release tablet Take 1 tablet by mouth daily    [provider]   atorvastatin (LIPITOR) 20 MG tablet TAKE 1 TABLET BY MOUTH ONCE DAILY FOR 90 DAYS 09/03/21   [provider]   hydrOXYzine pamoate (VISTARIL) 25 MG capsule TAKE 1 CAPSULE BY MOUTH THREE TIMES DAILY AS NEEDED FOR ANXIETY AND FOR SLEEP 09/22/21   [provider]   lisinopril (PRINIVIL;ZESTRIL) 10 MG tablet Take 1 tablet by mouth daily 09/03/21   [provider]   OLANZapine (ZYPREXA) 10 MG tablet Take 1 tablet by mouth nightly 07/19/21   [provider]   prazosin (MINIPRESS) 2 MG capsule Take 1 capsule by mouth daily 09/19/21   [provider]   prazosin (MINIPRESS) 1 MG capsule TAKE 1 CAPSULE BY MOUTH AT BEDTIME AFTER SUPPER 09/19/21   [provider]   BD PEN NEEDLE NANO 2ND GEN 32G X 4 MM MISC USE TO INJECT HUMALOG THREE TIMES A DAY AND LANTUS TWICE A DAY 09/26/21   [provider]   Lancets (ONETOUCH DELICA PLUS [id number]) [place] USE 1 TO CHECK GLUCOSE 4 TIMES DAILY 09/26/21   [provider]   insulin lispro, 1 Unit Dial, (HUMALOG/ADMELOG) 100 UNIT/ML SOPN INJECT 2 UNITS SUBCUTANEOUSLY THREE TIMES DAILY WITH MEALS 09/19/21   [provider]   LANTUS SOLOSTAR 100 UNIT/ML injection pen Inject 15 Units into the skin daily 09/19/21   [provider]   ONETOUCH ULTRA strip USE 1 STRIP TO CHECK GLUCOSE 4 TIMES  DAILY 09/26/21   [provider]       Current medications:    Current Facility-Administered Medications   Medication Dose Route Frequency Provider Last Rate Last Admin   . lidocaine PF 1 % injection 1 mL  1 mL IntraDERmal Once PRN Daleen Bo L, DO       . fentaNYL (SUBLIMAZE) injection 100 mcg  100 mcg IntraVENous Once PRN Daleen Bo L, DO       . lactated ringers IV soln infusion   IntraVENous Continuous Daleen Bo L, DO 125 mL/hr at 06/08/22 0643 New Bag at 06/08/22 8469   . sodium chloride flush 0.9 % injection 5-40 mL  5-40 mL IntraVENous 2 times per day Daleen Bo L, DO       . sodium chloride flush 0.9 % injection 5-40 mL  5-40 mL IntraVENous PRN Lyndal Reggio L, DO       . 0.9 % sodium chloride infusion   IntraVENous PRN Jon Gills, Shellia Hartl L, DO       . midazolam PF (VERSED) injection 2 mg  2 mg IntraVENous Once PRN Daleen Bo L, DO       . sodium chloride flush 0.9 % injection 5-40 mL  5-40 mL IntraVENous 2 times per day Marko Stai, MD       . sodium chloride flush 0.9 % injection 5-40 mL  5-40 mL IntraVENous PRN Marko Stai, MD       . 0.9 % sodium chloride infusion   IntraVENous PRN Marko Stai, MD       . ceFAZolin (ANCEF) 2,000 mg in sterile water 20 mL IV syringe  2,000 mg IntraVENous On Call to [place], MD           Allergies:    Allergies   Allergen Reactions   . Sulfa Antibiotics Anaphylaxis       Problem List:    Patient Active Problem List   Diagnosis Code   . Breast abscess N61.1   . Abscess of breast N61.1       Past Medical History:        Diagnosis Date   . Anesthesia complication     PT HAS WOKEN TWICE  DURING SURGERY   . Bipolar 1 disorder ([place])    . Depression    . Diabetes mellitus ([place])    . Foot fracture, left 2023   . Hyperlipidemia    . Hypertension    . Left breast abscess 03/2022   . Major depressive disorder    . PTSD (post-traumatic stress disorder)    . Schizophrenia ([place])     DIAGNOSED IN 2019       Past Surgical  History:        Procedure Laterality Date   . CESAREAN SECTION      2002, 2004, 2006, 2009, 2011   . ENDOSCOPY, COLON, DIAGNOSTIC     . LAPAROSCOPY  2004    TO REMOVE SCAR TISSUE FROM C-SECTION.   . TUBAL LIGATION  2011   . [place] I&D BREAST ABSCESS DEEP  12/02/2018    [place] I&D BREAST ABSCESS DEEP   . [place] I&D BREAST ABSCESS DEEP  06/24/2018    [place] I&D BREAST ABSCESS DEEP   . [place] I&D BREAST ABSCESS DEEP  06/15/2017    [place] I&D BREAST ABSCESS DEEP       Social History:    Social History     Tobacco Use   . Smoking status: Former     Average packs/day: 0.5 packs/day for 32.1 years (15.5 ttl pk-yrs)     Types: Cigarettes     Start date: 50   . Smokeless tobacco: Never   Substance Use Topics   . Alcohol use: Never                                Counseling given: Not Answered      Vital Signs (Current):   Vitals:    06/08/22 0618   BP: (!) 150/95   Pulse: 90   Resp: 16   Temp: 98 F (36.7 C)   TempSrc: Oral   SpO2: 98%   Weight: 99.8 kg (220 lb 0.3 oz)   Height: 1.803 m (5\' 11" )  BP Readings from Last 3 Encounters:   06/08/22 (!) 150/95   06/03/22 (!) 152/92   05/25/22 139/89       NPO Status: Time of last liquid consumption: 0430 (water)                        Time of last solid consumption: 2200                        Date of last liquid consumption: 06/08/22                        Date of last solid food consumption: 06/07/22    BMI:   Wt Readings from Last 3 Encounters:   06/08/22 99.8 kg (220 lb 0.3 oz)   06/03/22 99.8 kg (220 lb)   05/25/22 100.4 kg (221 lb 6.4 oz)     Body mass index is 30.69 kg/m.    CBC:   Lab Results   Component Value Date/Time    WBC 6.1 06/03/2022 08:25 AM    RBC 4.82 06/03/2022 08:25 AM    HGB 14.5 06/03/2022 08:25 AM    HCT 42.5 06/03/2022 08:25 AM    MCV 88.2 06/03/2022 08:25 AM    RDW 13.8 06/03/2022 08:25 AM    PLT 470 06/03/2022 08:25 AM       CMP:   Lab Results   Component Value Date/Time    NA 138 06/03/2022 08:25 AM    K 4.1 06/03/2022 08:25 AM     CL 109 06/03/2022 08:25 AM    CO2 25 06/03/2022 08:25 AM    BUN 6 06/03/2022 08:25 AM    CREATININE 0.83 06/03/2022 08:25 AM    LABGLOM >60 06/03/2022 08:25 AM    GLUCOSE 123 06/03/2022 08:25 AM    CALCIUM 9.6 06/03/2022 08:25 AM       POC Tests:   Recent Labs     06/08/22  0641   POCGLU 195*       Coags: No results found for: "PROTIME", "INR", "APTT"    HCG (If Applicable):   Lab Results   Component Value Date    PREGTESTUR Negative 06/08/2022        ABGs: No results found for: "PHART", "PO2ART", "PCO2ART", "HCO3ART", "BEART", "O2SATART"     Type & Screen (If Applicable):  No results found for: "LABABO", "LABRH"    Drug/Infectious Status (If Applicable):  No results found for: "HIV", "HEPCAB"    COVID-19 Screening (If Applicable): No results found for: "COVID19"        Anesthesia Evaluation  Patient summary reviewed and Nursing notes reviewed   no history of anesthetic complications:   Airway: Mallampati: III  TM distance: <3 FB   Neck ROM: full  Mouth opening: > = 3 FB   Dental: normal exam         Pulmonary:normal exam  breath sounds clear to auscultation  (+)           current smoker (Marijuana and vaping)    (-) shortness of breath          Patient did not smoke on day of surgery.                 Cardiovascular:  Exercise tolerance: good (>4 METS)  (+) hypertension:, hyperlipidemia    (-) past MI, CAD, CABG/stent and  angina    ECG reviewed  Rhythm: regular  Rate: normal           Beta Blocker:  Not on Beta Blocker         Neuro/Psych:   (+) psychiatric history:depression/anxiety             GI/Hepatic/Renal: Neg GI/Hepatic/Renal ROS            Endo/Other:    (+) DiabetesType II DM, poorly controlled, using insulin.                 Abdominal: normal exam            Vascular: negative vascular ROS.         Other Findings:         Anesthesia Plan      general     ASA 3       Induction: intravenous.    MIPS: Postoperative opioids intended and Prophylactic antiemetics administered.  Anesthetic plan and risks  discussed with patient.    Use of blood products discussed with patient whom consented to blood products.    Plan discussed with CRNA.    Attending anesthesiologist reviewed and agrees with Preprocedure content            Sidonie Dickens, DO   06/08/2022

## 2022-06-08 NOTE — Interval H&P Note (Signed)
Update History & Physical    The patient's History and Physical of May 25, 2022 was reviewed with the patient and I examined the patient. There was no change. The surgical site was confirmed by the patient and me.     Plan: The risks, benefits, expected outcome, and alternative to the recommended procedure have been discussed with the patient. Patient understands and wants to proceed with the procedure.     Electronically signed by Gerre Scull, MD on 06/08/2022 at 7:09 AM

## 2022-06-08 NOTE — Other (Signed)
Pt much calmer now. States feeling better. Pain controlled. Denies n/v. VSS.

## 2022-06-08 NOTE — Anesthesia Post-Procedure Evaluation (Signed)
Department of Anesthesiology  Postprocedure Note    Patient: Paula Hernandez  MRN: [id number]  Birthdate: 1975/07/24  Date of evaluation: 06/08/2022    Procedure Summary       Date: 06/08/22 Room / Location: [facility] MAIN OR 15 / [facility] MAIN OR    Anesthesia Start: 0726 Anesthesia Stop: 0848    Procedure: LEFT BREAST DUCTAL EXCISION (Left: Breast) Diagnosis:       Abscess of breast      (Abscess of breast [N61.1])    Providers: Marko Stai, MD Responsible Provider: Sidonie Dickens, DO    Anesthesia Type: General ASA Status: 3            Anesthesia Type: General    Aldrete Phase I: Aldrete Score: 10    Aldrete Phase II: Aldrete Score: 10    Anesthesia Post Evaluation    Patient location during evaluation: PACU  Patient participation: complete - patient participated  Level of consciousness: awake and alert  Pain score: 0  Airway patency: patent  Nausea & Vomiting: no nausea and no vomiting  Cardiovascular status: blood pressure returned to baseline and hemodynamically stable  Respiratory status: room air  Hydration status: euvolemic  Multimodal analgesia pain management approach  Pain management: adequate and satisfactory to patient    No notable events documented.

## 2022-06-12 LAB — CULTURE, TISSUE
Culture: NO GROWTH
Gram Stain: NONE SEEN

## 2022-06-19 ENCOUNTER — Ambulatory Visit: Admit: 2022-06-19 | Discharge: 2022-06-19 | Payer: MEDICAID | Attending: Nurse Practitioner | Primary: Primary Care

## 2022-06-19 DIAGNOSIS — N611 Abscess of the breast and nipple: Secondary | ICD-10-CM

## 2022-06-19 MED ORDER — IBUPROFEN 600 MG PO TABS
600 MG | ORAL_TABLET | Freq: Three times a day (TID) | ORAL | 0 refills | Status: DC | PRN
Start: 2022-06-19 — End: 2023-10-20

## 2022-06-19 NOTE — Progress Notes (Signed)
Subjective:      Paula Hernandez is a 47 y.o. female who presents today for wound check. She is 2 weeks status post   left breast ductal excision.     She states she is upset with her incision and how her nipple look.s She is tearful in the office.   She states that she still is having a lot of drainage from the pinrose drain. She complains of pain at the incision and drain site. She is not taking anything.   Patient feels "depressed". She has a therapist that she sees regularly.   Ms. Kitchens has a reminder for a "due or due soon" health maintenance. I have asked that she contact her primary care provider for follow-up on this health maintenance.          Objective:     BP (!) 147/94 (Site: Left Upper Arm, Position: Sitting, Cuff Size: Large Adult)   Pulse 99   Temp 98.5 F (36.9 C) (Oral)   Resp 16   Ht 1.803 m (5\' 11" )   Wt 99.3 kg (219 lb)   LMP 06/01/2022 (Exact Date) Comment: CURRENTLY ON MENSTRUAL  SpO2 96%   BMI 30.54 kg/m     Wound:   No redness, pinrose drain intact. Serous drainage. Left nipple inverted     Assessment:     Wound check.       Plan:   Will keep drain in place.   Patient would like to see Dr. Jerline Pain at next visit.   She stated that she needed 5 business days to get a ride and needed an early morning appointment. She scheduled 07/01/2022 to see Dr. Jerline Pain.   Start Ibuprfoen 600 mg every 8 hours as needed for pain.   May apply heat or ice to help with discomfort.   Patient will follow up with PCP about BP    13 minutes spent face to face patient, >50 % of time spent counseling.   Dineen Kid, APRN - NP

## 2022-06-19 NOTE — Progress Notes (Signed)
Identified patient with two patient identifiers (name and DOB). Reviewed chart in preparation for visit and have obtained necessary documentation.    Paula Hernandez is a 47 y.o. female  Chief Complaint   Patient presents with    Post-Op Check     1 week s/p left breast ductal excision.      BP (!) 147/94 (Site: Left Upper Arm, Position: Sitting, Cuff Size: Large Adult)   Pulse 99   Temp 98.5 F (36.9 C) (Oral)   Resp 16   Ht 1.803 m (5\' 11" )   Wt 99.3 kg (219 lb)   LMP 06/01/2022 (Exact Date) Comment: CURRENTLY ON MENSTRUAL  SpO2 96%   BMI 30.54 kg/m     1. Have you been to the ER, urgent care clinic since your last visit?  Hospitalized since your last visit?no    2. Have you seen or consulted any other health care providers outside of the [facility] since your last visit?  Include any pap smears or colon screening. Therapist and Psychiatrist     Patient and provider made aware of elevated BP x2. Patient asymptomatic. Patient reminded to monitor BP, continue to take BP medications if prescribed, and follow up with PCP/Cardiologist.  Patient expressed understanding and agreement.

## 2022-07-01 ENCOUNTER — Ambulatory Visit: Admit: 2022-07-01 | Discharge: 2022-07-01 | Payer: MEDICAID | Attending: Surgical Oncology | Primary: Primary Care

## 2022-07-01 DIAGNOSIS — Z4889 Encounter for other specified surgical aftercare: Secondary | ICD-10-CM

## 2022-07-01 NOTE — Progress Notes (Signed)
Subjective:       KAYTI VACCARELLO presents to the clinic 3 weeks following left breast ductal excision. Eating a regular diet without difficulty. Bowel movements are Normal.  The patient is not having any pain..      Objective:      BP (!) 133/90 (Site: Right Upper Arm, Position: Sitting, Cuff Size: Large Adult)   Pulse (!) 109   Temp 98.3 F (36.8 C) (Oral)   Resp 16   Ht 1.803 m (5' 11"$ )   Wt 98.6 kg (217 lb 6.4 oz)   LMP 06/01/2022 (Exact Date) Comment: CURRENTLY ON MENSTRUAL  SpO2 96%   BMI 30.32 kg/m     General:  alert, appears stated age, and cooperative       Incision:   healing well, no erythema, well approximated, incision well approximated Drain removed as well as sutures      Assessment:      Doing well postoperatively.      Plan:      1. Continue any current medications.  2. Wound care discussed.  3. Return to full duty in 2 weeks.  4. Follow up:  as needed.

## 2022-07-01 NOTE — Progress Notes (Signed)
Identified patient with two patient identifiers (name and DOB). Reviewed chart in preparation for visit and have obtained necessary documentation.    Paula Hernandez is a 47 y.o. female  Chief Complaint   Patient presents with    Post-Op Check     3 weeks s/p left breast ductal excision      BP (!) 133/90 (Site: Right Upper Arm, Position: Sitting, Cuff Size: Large Adult)   Pulse (!) 109   Temp 98.3 F (36.8 C) (Oral)   Resp 16   Ht 1.803 m (5' 11"$ )   Wt 98.6 kg (217 lb 6.4 oz)   LMP 06/01/2022 (Exact Date) Comment: CURRENTLY ON MENSTRUAL  SpO2 96%   BMI 30.32 kg/m     1. Have you been to the ER, urgent care clinic since your last visit?  Hospitalized since your last visit?no    2. Have you seen or consulted any other health care providers outside of the [facility] since your last visit?  Include any pap smears or colon screening. Yes therapist     Patient and provider made aware of elevated BP x2. Patient asymptomatic. Patient reminded to monitor BP, continue to take BP medications if prescribed, and follow up with PCP/Cardiologist.  Patient expressed understanding and agreement.

## 2023-10-10 NOTE — Progress Notes (Signed)
 [facility] Note    Patient ID: Paula Hernandez is a 48 y.o. female.  Chief Complaint   Patient presents with   . Suture / Staple Removal       History of Present Illness  Here for suture removal.   Was seen in ER on 10/04/2023 and had laceration repair with 3 sutures of right ring finger.  Patient stated it continues to be very painful.  7/10 pain.   Had clear drainage but no purulent drainage from wound.   Denies chills and fever.       ROS  Review of Systems   Constitutional: Negative.    Respiratory: Negative.     Cardiovascular: Negative.    Gastrointestinal: Negative.    Skin:         Right ring finger laceration distal palmar side with 3 sutures.         Objective Information  Visit Vitals  BP 124/88   Pulse 100   Temp 37.3 C (99.1 F) (Oral)   Resp 17   SpO2 97%   Smoking Status Every Day       Physical Exam  Vitals and nursing note reviewed.   Constitutional:       General: She is not in acute distress.     Appearance: Normal appearance. She is not ill-appearing.   HENT:      Head: Normocephalic.   Eyes:      Conjunctiva/sclera: Conjunctivae normal.   Cardiovascular:      Rate and Rhythm: Normal rate and regular rhythm.   Pulmonary:      Effort: Pulmonary effort is normal. No respiratory distress.      Breath sounds: Normal breath sounds.   Musculoskeletal:      Cervical back: Normal range of motion and neck supple.   Skin:     General: Skin is warm and dry.      Comments: Right ring finger distal popular palmar: 3 sutures intact.  Swelling present.  Very painful to light palpation.  Unable to tolerate slight touch of suture with forceps.  Scab present.  No drainage or erythema.   Neurological:      General: No focal deficit present.      Mental Status: She is alert and oriented to person, place, and time.         Procedures      Assessment and Plan  Refer to [facility] ER.  Report called to Dr. Chanda Combes at [facility] emergency room.  Patient cannot tolerate laceration area  being touched or sutures being touched.  Referred for evaluation.    Problem List Items Addressed This Visit    None  Visit Diagnoses         Pain from laceration    -  Primary              Requested Prescriptions      No prescriptions requested or ordered in this encounter         Review of Chart Information  I have reviewed medical history, past surgical history, social history, current medications, and history of allergies.    Allergies   Allergen Reactions   . Sulfa Antibiotics Anaphylaxis   . Lurasidone Unknown         Current Outpatient Medications:   .  atorvastatin (Lipitor) 10 MG tablet, Take 10 mg by mouth daily., Disp: , Rfl:   .  cholecalciferol 25 MCG (1000 UT) tablet, Take 25 mcg by  mouth daily., Disp: , Rfl:   .  cyclobenzaprine (Flexeril) 10 MG tablet, Take 1 tablet at night before going to sleep, Disp: 15 tablet, Rfl: 0  .  hydrOXYzine pamoate (Vistaril) 25 MG capsule, Take 25 mg by mouth 3 times a day as needed for itching., Disp: , Rfl:   .  insulin glargine (Lantus) 100 unit/mL injection, Inject 28 Units under the skin daily., Disp: , Rfl:   .  lisinopril (Prinivil, Zestril) 10 MG tablet, Take 10 mg by mouth daily., Disp: , Rfl:   .  lurasidone (Latuda) 20 MG tablet, Take 80 mg by mouth daily., Disp: , Rfl:   .  OLANZapine zydis (ZyPREXA) 15 MG disintegrating tablet, Dissolve 15 mg by mouth at bedtime., Disp: , Rfl:   .  prazosin (Minipress) 1 MG capsule, Take 1 capsule by mouth daily., Disp: , Rfl:   .  lidocaine  (Lidoderm ) 5 % patch, Apply 1 patch topically daily. Apply to painful area 12 hours per day, remove for 12 hours. (Patient not taking: Reported on 10/10/2023), Disp: 30 patch, Rfl: 0  .  methocarbamol (Robaxin) 500 MG tablet, Take 1 tablet by mouth 4 times a day as needed for muscle spasms (and/or pain) for up to 5 days. DO NOT TAKE WHILE DRIVING OR WORKING (Patient not taking: Reported on 10/10/2023), Disp: 5 tablet, Rfl: 0    Patient Active Problem List    Diagnosis Date Noted   . Closed  fracture of base of fifth metatarsal bone of left foot 07/28/2021           Diagnosis Date   . Anxiety    . Depression    . Diabetes mellitus (CMS/HCC)    . Fracture     5th toe  left foot   . Hyperlipidemia    . Hypertension        Past Surgical History:   Procedure Laterality Date   . BREAST CYST EXCISION Left 12/13/2017    Legacy procedure comments: Procedure: Excision of left breast lesion, peri-areolar;  Surgeon: Wynetta Heckle., MD;  Location: [facility];  Service: General; Legacy procedure name: PR EXCISE BREAST CYST   . INCISIONAL BREAST BIOPSY Left 06/22/2017    Legacy procedure comments: Procedure: Excisional biopsy left breast;  Surgeon: Cornelio Dike Ebb Goldman., MD;  Location: [facility];  Service: General; Legacy procedure name: PR BIOPSY OF BREAST, INCISIONAL   . MASTECTOMY, PARTIAL Left 12/26/2018    Legacy procedure comments: Procedure: LEFT BREAST LUMPECTOMY;  Surgeon: Wynetta Heckle., MD;  Location: [facility];  Service: General; Legacy procedure name: PR MASTECTOMY, PARTIAL   . OTHER SURGICAL HISTORY      Legacy procedure comments: x5; Legacy procedure name: CESAREAN SECTION   . OTHER SURGICAL HISTORY      Legacy procedure comments: had alot of scar tissue; Legacy procedure name: DIAGNOSTIC LAPAROSCOPY       Social History     Tobacco Use   . Smoking status: Every Day     Current packs/day: 0.50     Average packs/day: 0.5 packs/day for 30.0 years (15.0 ttl pk-yrs)     Types: Cigarettes   . Smokeless tobacco: Never   Vaping Use   . Vaping status: Never Used   Substance Use Topics   . Alcohol use: Never   . Drug use: Yes     Types: Marijuana       Family History   Problem Relation Name Age of Onset   .  Coronary artery disease Mother     . Multiple sclerosis Mother     . Cancer Mother's Sister          breast        Immunization History   Administered Date(s) Administered   . Tdap 10/03/2023            Patient to follow-up in with primary care provider if symptoms persist, earlier if worse. Patient  understands and agrees with plan.     Collaborating MD available: Dr. Jenkins Mo    Please note that this dictation was completed with Dragon, the computer voice recognition software. Quite often unanticipated grammatical, syntax, homophones, and other interpretive errors are inadvertently transcribed by the computer software. Please disregard these errors. Please excuse any errors that have escaped final proofreading.     Signed:   Scottie Cypher, NP
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
## 2023-10-20 ENCOUNTER — Ambulatory Visit: Admit: 2023-10-20 | Payer: Medicaid (Managed Care) | Primary: Primary Care

## 2023-10-20 ENCOUNTER — Ambulatory Visit: Admit: 2023-10-20 | Discharge: 2023-10-20 | Payer: Medicaid (Managed Care) | Attending: Medical | Primary: Primary Care

## 2023-10-20 VITALS — Ht 71.0 in | Wt 195.0 lb

## 2023-10-20 DIAGNOSIS — M20021 Boutonniere deformity of right finger(s): Secondary | ICD-10-CM
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
